# Patient Record
Sex: Female | Born: 2000 | Race: Black or African American | Hispanic: No | Marital: Single | Smoking: Never smoker
Health system: Southern US, Community
[De-identification: ages and names within clinical notes are randomized; demographics above are authoritative.]

## PROBLEM LIST (undated history)

## (undated) DIAGNOSIS — J302 Other seasonal allergic rhinitis: Secondary | ICD-10-CM

---
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
## 2020-08-14 ENCOUNTER — Ambulatory Visit: Payer: Self-pay | Admitting: Nurse Practitioner

## 2022-04-10 ENCOUNTER — Other Ambulatory Visit: Payer: Self-pay

## 2022-04-10 ENCOUNTER — Inpatient Hospital Stay (HOSPITAL_COMMUNITY)
Admission: EM | Admit: 2022-04-10 | Discharge: 2022-04-15 | DRG: 059 | Disposition: A | Discharge status: Home / Self Care | Payer: PRIVATE HEALTH INSURANCE | Attending: Family Medicine | Admitting: Family Medicine

## 2022-04-10 ENCOUNTER — Encounter (HOSPITAL_COMMUNITY): Payer: Self-pay | Admitting: Emergency Medicine

## 2022-04-10 DIAGNOSIS — R262 Difficulty in walking, not elsewhere classified: Secondary | ICD-10-CM | POA: Diagnosis present

## 2022-04-10 DIAGNOSIS — R8290 Unspecified abnormal findings in urine: Secondary | ICD-10-CM | POA: Diagnosis present

## 2022-04-10 DIAGNOSIS — M21371 Foot drop, right foot: Secondary | ICD-10-CM | POA: Diagnosis present

## 2022-04-10 DIAGNOSIS — F1290 Cannabis use, unspecified, uncomplicated: Secondary | ICD-10-CM | POA: Diagnosis present

## 2022-04-10 DIAGNOSIS — R3911 Hesitancy of micturition: Secondary | ICD-10-CM | POA: Diagnosis present

## 2022-04-10 DIAGNOSIS — N390 Urinary tract infection, site not specified: Secondary | ICD-10-CM | POA: Diagnosis present

## 2022-04-10 DIAGNOSIS — R200 Anesthesia of skin: Secondary | ICD-10-CM | POA: Diagnosis present

## 2022-04-10 DIAGNOSIS — Z820 Family history of epilepsy and other diseases of the nervous system: Secondary | ICD-10-CM

## 2022-04-10 DIAGNOSIS — F419 Anxiety disorder, unspecified: Secondary | ICD-10-CM | POA: Diagnosis present

## 2022-04-10 DIAGNOSIS — G35 Multiple sclerosis: Secondary | ICD-10-CM | POA: Diagnosis not present

## 2022-04-10 DIAGNOSIS — G379 Demyelinating disease of central nervous system, unspecified: Principal | ICD-10-CM | POA: Diagnosis present

## 2022-04-10 DIAGNOSIS — R8281 Pyuria: Secondary | ICD-10-CM | POA: Insufficient documentation

## 2022-04-10 HISTORY — DX: Other seasonal allergic rhinitis: J30.2

## 2022-04-10 LAB — CBC
HCT: 40.8 % (ref 36.0–46.0)
Hemoglobin: 13.6 g/dL (ref 12.0–15.0)
MCH: 28.9 pg (ref 26.0–34.0)
MCHC: 33.3 g/dL (ref 30.0–36.0)
MCV: 86.8 fL (ref 80.0–100.0)
Platelets: 313 10*3/uL (ref 150–400)
RBC: 4.7 MIL/uL (ref 3.87–5.11)
RDW: 13.7 % (ref 11.5–15.5)
WBC: 7.3 10*3/uL (ref 4.0–10.5)
nRBC: 0 % (ref 0.0–0.2)

## 2022-04-10 LAB — URINALYSIS, ROUTINE W REFLEX MICROSCOPIC
Bilirubin Urine: NEGATIVE
Glucose, UA: NEGATIVE mg/dL
Hgb urine dipstick: NEGATIVE
Ketones, ur: 80 mg/dL — AB
Nitrite: NEGATIVE
Protein, ur: 30 mg/dL — AB
Specific Gravity, Urine: 1.031 — ABNORMAL HIGH (ref 1.005–1.030)
pH: 5 (ref 5.0–8.0)

## 2022-04-10 LAB — COMPREHENSIVE METABOLIC PANEL
ALT: 15 U/L (ref 0–44)
AST: 14 U/L — ABNORMAL LOW (ref 15–41)
Albumin: 3.8 g/dL (ref 3.5–5.0)
Alkaline Phosphatase: 46 U/L (ref 38–126)
Anion gap: 7 (ref 5–15)
BUN: 9 mg/dL (ref 6–20)
CO2: 22 mmol/L (ref 22–32)
Calcium: 9.4 mg/dL (ref 8.9–10.3)
Chloride: 110 mmol/L (ref 98–111)
Creatinine, Ser: 0.84 mg/dL (ref 0.44–1.00)
GFR, Estimated: >60 mL/min (ref >60)
Glucose, Bld: 91 mg/dL (ref 70–99)
Potassium: 4 mmol/L (ref 3.5–5.1)
Sodium: 139 mmol/L (ref 135–145)
Total Bilirubin: 1 mg/dL (ref 0.3–1.2)
Total Protein: 7.3 g/dL (ref 6.5–8.1)

## 2022-04-10 LAB — I-STAT BETA HCG BLOOD, ED (MC, WL, AP ONLY): I-stat hCG, quantitative: <5 m[IU]/mL (ref <5)

## 2022-04-10 LAB — CK: Total CK: 71 U/L (ref 38–234)

## 2022-04-10 NOTE — ED Provider Triage Note (Signed)
Emergency Medicine Provider Triage Evaluation Note ? ?Robin Mercer , a 21 y.o. female  was evaluated in triage.  Pt complains of intermittent numbness since about 1 week ago. She states she has had some weakness for 1 month (and was referred to neurology then).  ? ?She states the weakness seems to follow her anxiety however now weakness is more often.  ? ?States currently no weakness.  ? ? ?Apt w neurology on the 30th. ? ?Review of Systems  ?Positive: Leg weakness and numbness ?Negative: Back pain, fever ? ?Physical Exam  ?BP 126/73 (BP Location: Right Arm)   Pulse 75   Temp 98.6 ?F (37 ?C) (Oral)   Resp 16   Ht 5\' 6"  (1.676 m)   Wt 76 kg   SpO2 100%   BMI 27.04 kg/m?  ?Gen:   Awake, tearful ?Resp:  Normal effort  ?MSK:   Moves extremities without difficulty  ?Other:  No midline C/T/L spine TTP  ?Strenght in LE symmetric ? ?Medical Decision Making  ?Medically screening exam initiated at 9:17 PM.  Appropriate orders placed.  Emmerie Battaglia was informed that the remainder of the evaluation will be completed by another provider, this initial triage assessment does not replace that evaluation, and the importance of remaining in the ED until their evaluation is complete. ? ?Labs.  ?  ?Robin Mercer, Gailen Shelter ?04/10/22 2121 ? ?

## 2022-04-10 NOTE — ED Triage Notes (Signed)
Patient reports intermittent right leg numbness onset last week worse when walking , denies injury , ambulatory .  ?

## 2022-04-11 ENCOUNTER — Encounter (HOSPITAL_COMMUNITY): Payer: Self-pay | Admitting: Internal Medicine

## 2022-04-11 ENCOUNTER — Emergency Department (HOSPITAL_COMMUNITY): Payer: PRIVATE HEALTH INSURANCE

## 2022-04-11 DIAGNOSIS — F1290 Cannabis use, unspecified, uncomplicated: Secondary | ICD-10-CM | POA: Diagnosis present

## 2022-04-11 DIAGNOSIS — F419 Anxiety disorder, unspecified: Secondary | ICD-10-CM | POA: Diagnosis present

## 2022-04-11 DIAGNOSIS — R262 Difficulty in walking, not elsewhere classified: Secondary | ICD-10-CM | POA: Diagnosis present

## 2022-04-11 DIAGNOSIS — M21371 Foot drop, right foot: Secondary | ICD-10-CM | POA: Diagnosis present

## 2022-04-11 DIAGNOSIS — R8290 Unspecified abnormal findings in urine: Secondary | ICD-10-CM | POA: Diagnosis not present

## 2022-04-11 DIAGNOSIS — G35 Multiple sclerosis: Secondary | ICD-10-CM | POA: Diagnosis present

## 2022-04-11 DIAGNOSIS — R3911 Hesitancy of micturition: Secondary | ICD-10-CM | POA: Insufficient documentation

## 2022-04-11 DIAGNOSIS — N390 Urinary tract infection, site not specified: Secondary | ICD-10-CM | POA: Diagnosis present

## 2022-04-11 DIAGNOSIS — G379 Demyelinating disease of central nervous system, unspecified: Secondary | ICD-10-CM | POA: Insufficient documentation

## 2022-04-11 DIAGNOSIS — Z820 Family history of epilepsy and other diseases of the nervous system: Secondary | ICD-10-CM | POA: Diagnosis not present

## 2022-04-11 DIAGNOSIS — R200 Anesthesia of skin: Secondary | ICD-10-CM | POA: Diagnosis present

## 2022-04-11 DIAGNOSIS — R8281 Pyuria: Secondary | ICD-10-CM | POA: Diagnosis not present

## 2022-04-11 LAB — HIV ANTIBODY (ROUTINE TESTING W REFLEX): HIV Screen 4th Generation wRfx: NONREACTIVE

## 2022-04-11 LAB — TSH: TSH: 0.937 u[IU]/mL (ref 0.350–4.500)

## 2022-04-11 IMAGING — MR MR LUMBAR SPINE WO/W CM
4 of 7 series · 25 of 48 positions shown · IV contrast (gadavist)
Comparison: Brain MRI today reported separately.

CLINICAL DATA: 21-year-old female with intermittent numbness and
weakness. Impaired coordination of the right leg.

EXAM:
MRI LUMBAR SPINE WITHOUT AND WITH CONTRAST
TECHNIQUE: Multiplanar and multiecho pulse sequences of the lumbar spine were
obtained without and with intravenous contrast.
CONTRAST:  7mL GADAVIST GADOBUTROL 1 MMOL/ML IV SOLN

[Series 13: T2 · sagittal · 4.0mm · 0.68mm/px · 3 of 16 slices shown (1 of 2)]
[im 1/16]
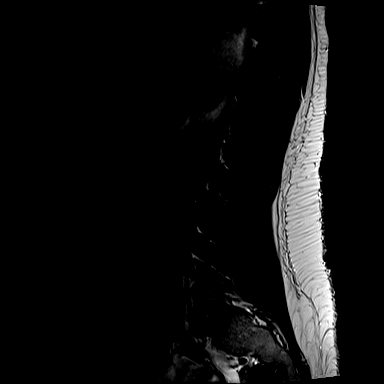
[im 8/16]
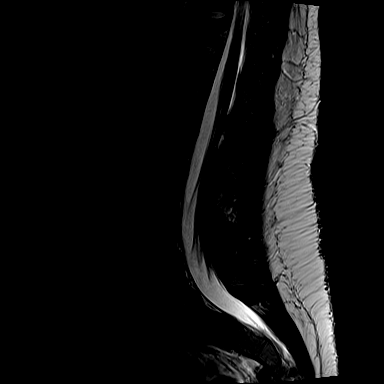
[im 16/16]
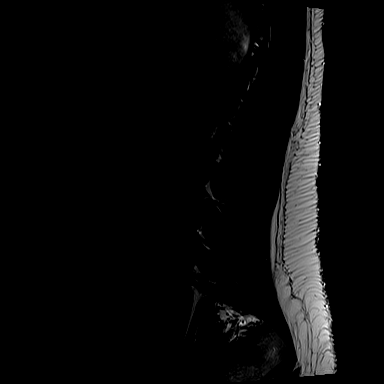

[Series 15: T1 · sagittal · 4.0mm · 0.88mm/px · 4 of 16 slices shown (1 of 2)]
[im 1/16]
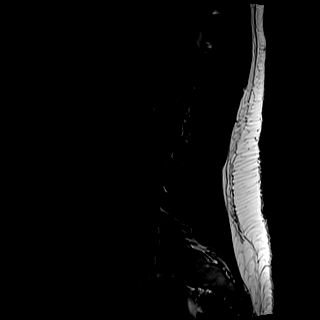
[im 6/16]
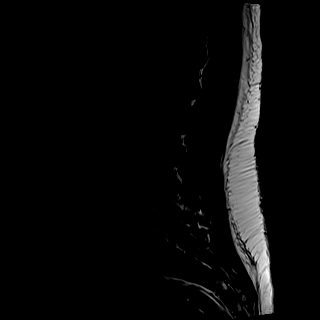
[im 11/16]
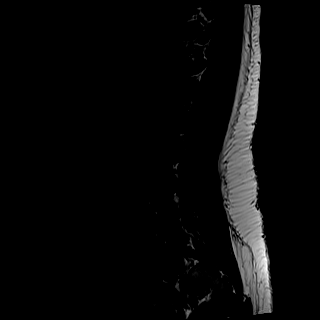
[im 16/16]
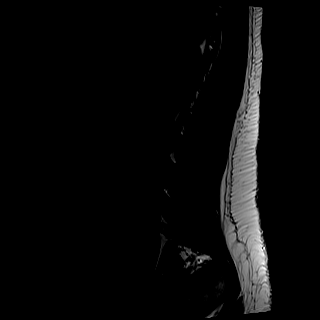

[Series 16: T2 · axial · 4.0mm · 0.62mm/px · z∈[-564,-319]mm · 11 of 46 slices shown (2 of 2)]
[im 1/46]
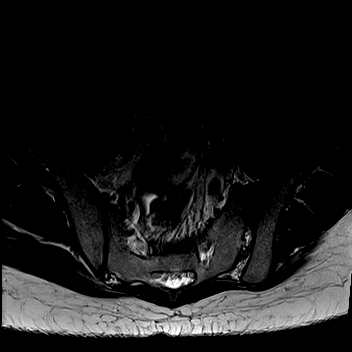
[im 5/46]
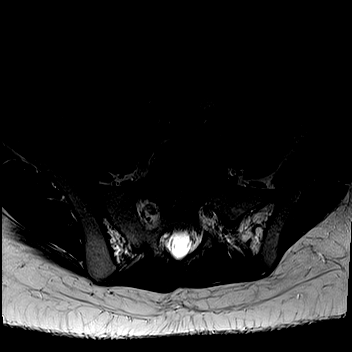
[im 10/46]
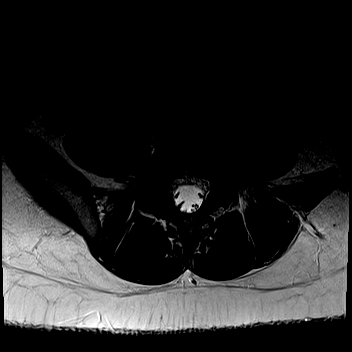
[im 14/46]
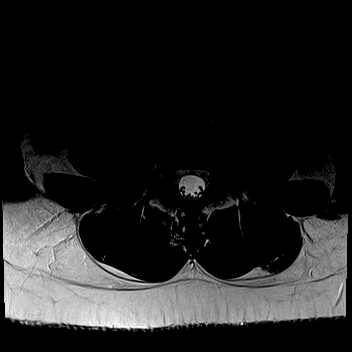
[im 19/46]
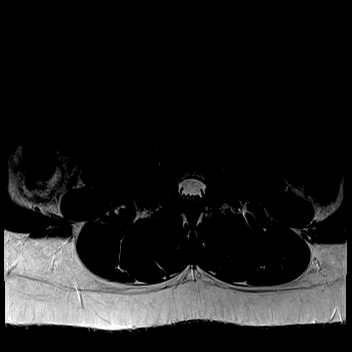
[im 23/46]
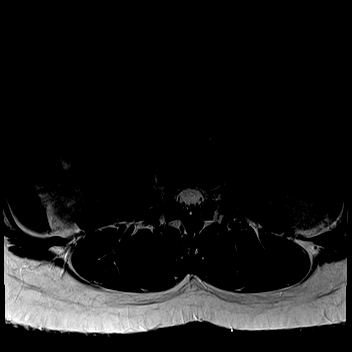
[im 28/46]
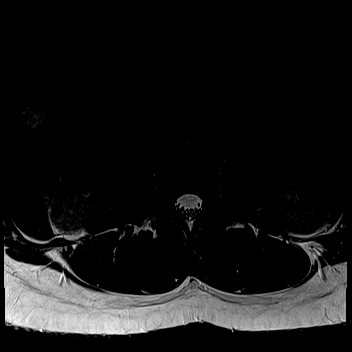
[im 32/46]
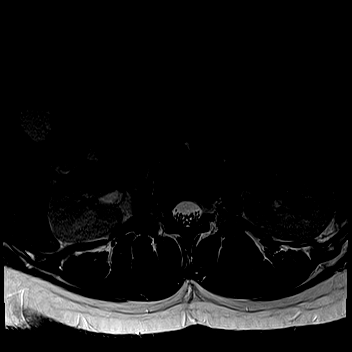
[im 37/46]
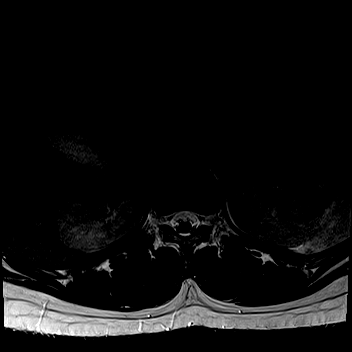
[im 41/46]
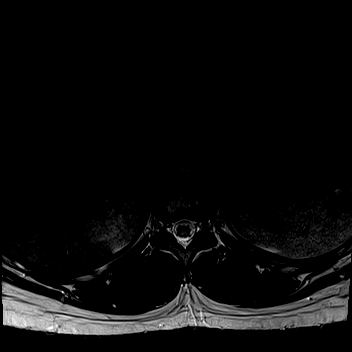
[im 46/46]
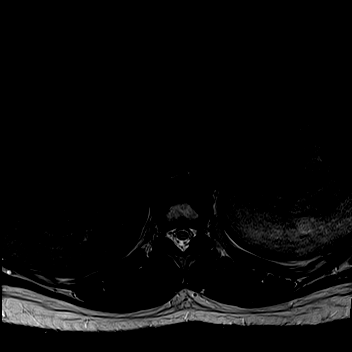

[Series 17: T1 · axial · 4.0mm · 0.34mm/px · z∈[-564,-344]mm · 7 of 46 slices shown (2 of 2)]
[im 1/46]
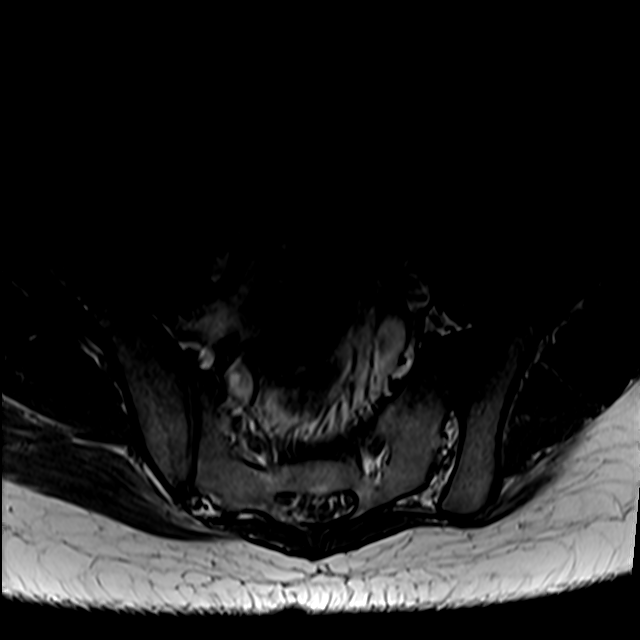
[im 5/46]
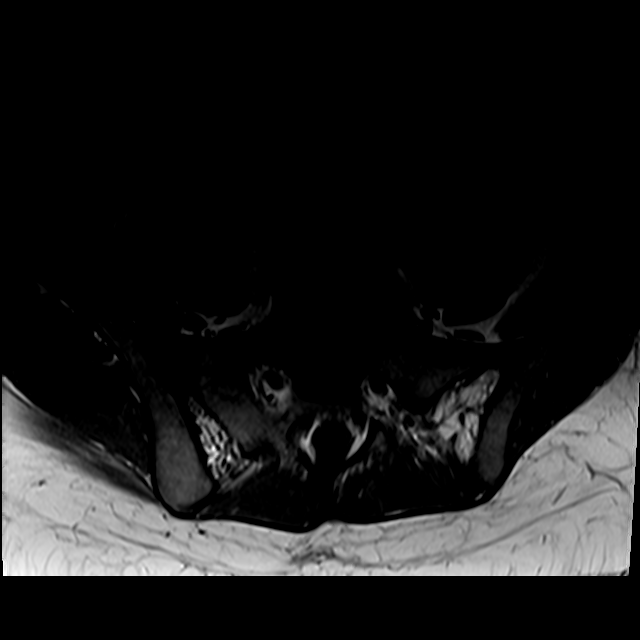
[im 10/46]
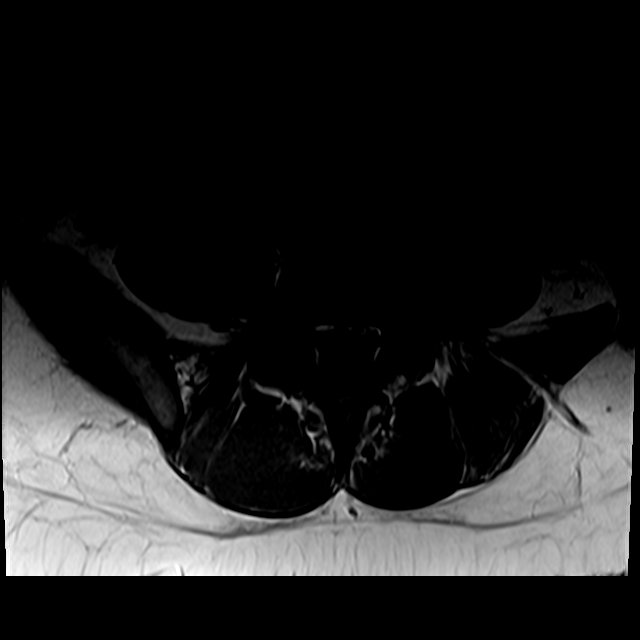
[im 14/46]
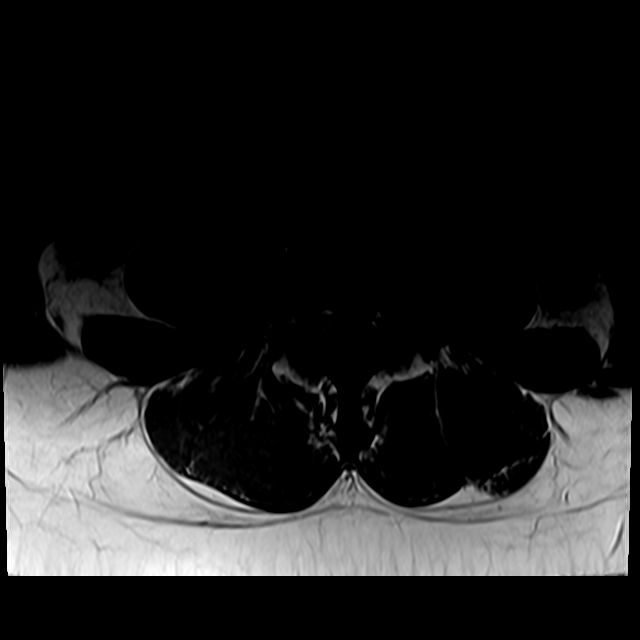
[im 19/46]
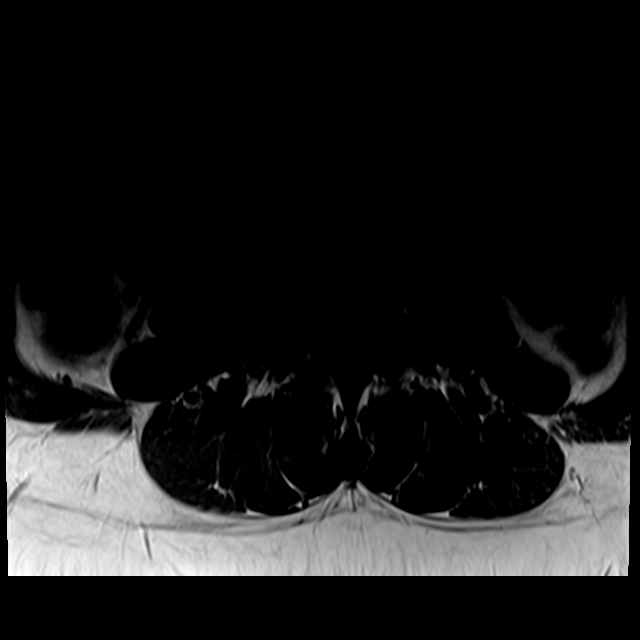
[im 23/46]
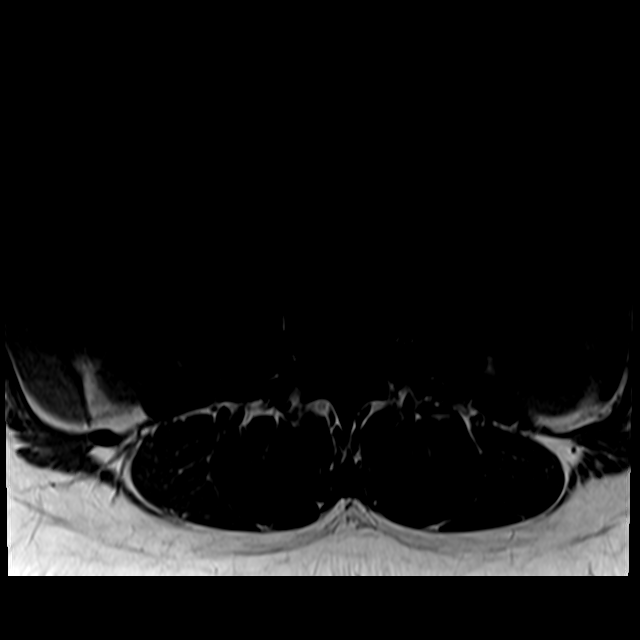
[im 41/46]
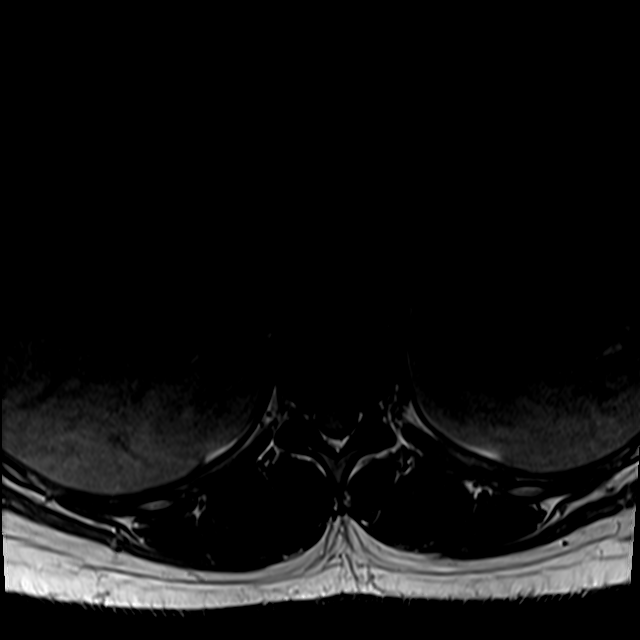

[25 of 48 positions shown; findings below may reference images not displayed]

FINDINGS: Segmentation: Lumbar segmentation appears to be normal and will be
designated as such for this report.

Alignment: Normal lumbar lordosis. There is subtle levoconvex lumbar
scoliosis. No spondylolisthesis.

Vertebrae: No marrow edema or evidence of acute osseous abnormality.
Visualized bone marrow signal is within normal limits. Intact
visible sacrum.

Conus medullaris and cauda equina: Conus extends to the T12-L1

Signal in the conus appears normal, but there is subtle STIR and
axial T2 imaging evidence of abnormal lower thoracic spinal cord
lesions at T10 and T11/T12 (series 14, image 8 and series 16, image
7). No evidence of abnormal lower spinal cord or conus enhancement.

Cauda equina nerve roots appear normal. No abnormal lumbar
intradural enhancement. No dural thickening. Level. Conus and cauda
equina appear normal.

Paraspinal and other soft tissues: Negative.

Disc levels:

Essentially negative. Capacious spinal canal. Normal intervertebral
disc signal and morphology.

There is mild right side lower lumbar posterior element hypertrophy.
IMPRESSION: Negative MRI appearance of the Lumbar Spine, but evidence of
multifocal lower thoracic Spinal Cord lesions suspicious for chronic
demyelinating in light of Brain MRI findings today.

## 2022-04-11 IMAGING — MR MR HEAD WO/W CM
14 of 16 series · 42 of 48 positions shown · IV contrast (gadavist)
Comparison: None Available.

CLINICAL DATA: 21-year-old female with intermittent numbness and
weakness. Impaired coordination of the right leg.

EXAM:
MRI HEAD WITHOUT AND WITH CONTRAST
TECHNIQUE: Multiplanar, multiecho pulse sequences of the brain and surrounding
structures were obtained without and with intravenous contrast.
CONTRAST:  7mL GADAVIST GADOBUTROL 1 MMOL/ML IV SOLN

[Series 5: DWI · axial · 3.0mm · 0.96mm/px · z∈[-130,+13]mm · 8 of 114 slices shown (1 of 4)]
[im 1/114]
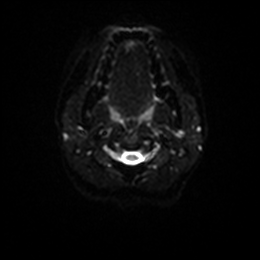
[im 17/114]
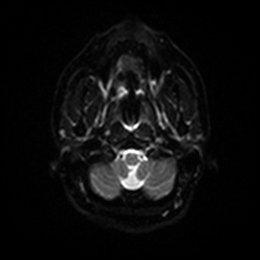
[im 33/114]
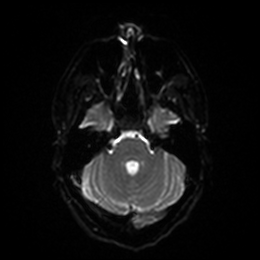
[im 49/114]
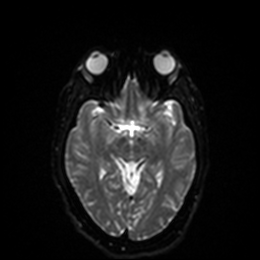
[im 65/114]
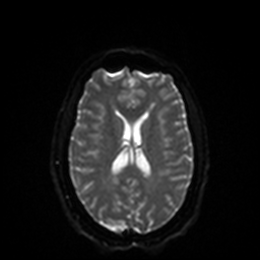
[im 81/114]
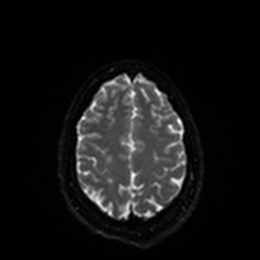
[im 97/114]
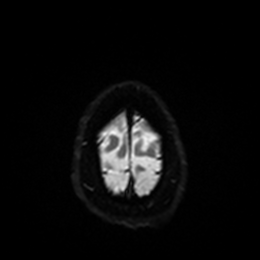
[im 114/114]
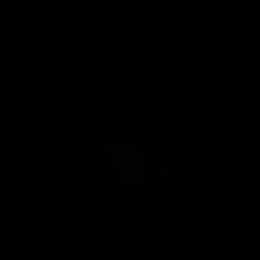

[Series 6: DWI · axial · 3.0mm · 0.96mm/px · z∈[-130,+13]mm · 3 of 57 slices shown (2 of 4)]
[im 1/57]
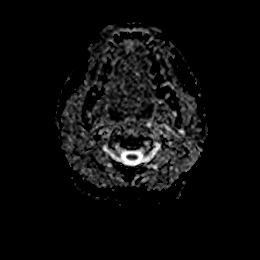
[im 29/57]
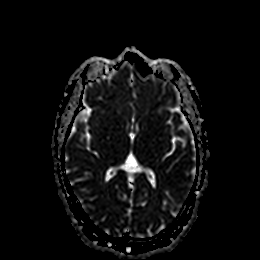
[im 57/57]
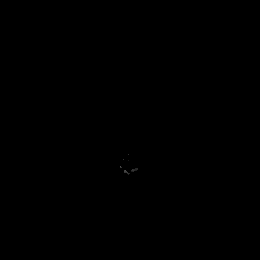

[Series 7: DWI · coronal · 4.0mm · 0.88mm/px · 5 of 84 slices shown (3 of 4)]
[im 1/84]
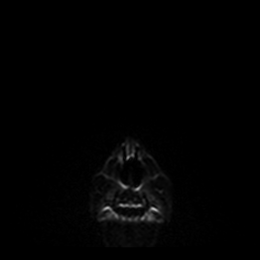
[im 21/84]
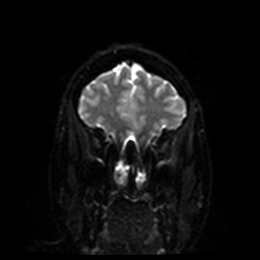
[im 42/84]
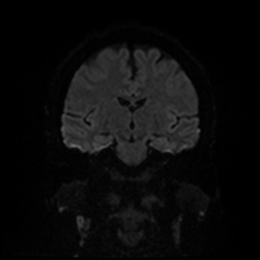
[im 63/84]
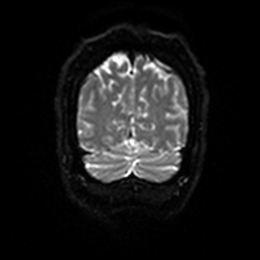
[im 84/84]
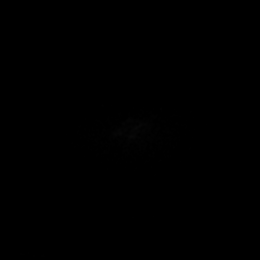

[Series 8: DWI · coronal · 4.0mm · 0.88mm/px · 2 of 42 slices shown (4 of 4)]
[im 1/42]
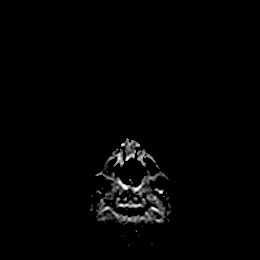
[im 42/42]
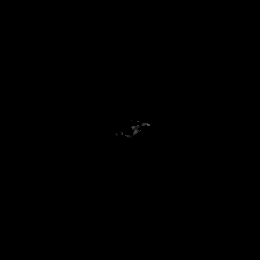

[Series 9: T1 · sagittal · 5.0mm · 0.78mm/px · 2 of 27 slices shown]
[im 1/27]
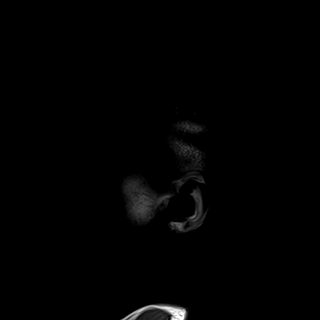
[im 27/27]
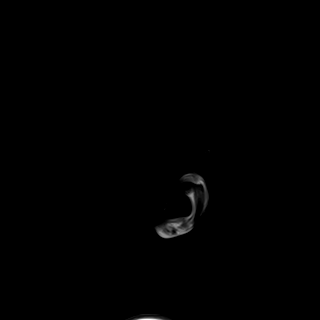

[Series 10: T2 · axial · 5.0mm · 0.78mm/px · z∈[-133,+16]mm · 2 of 30 slices shown]
[im 1/30]
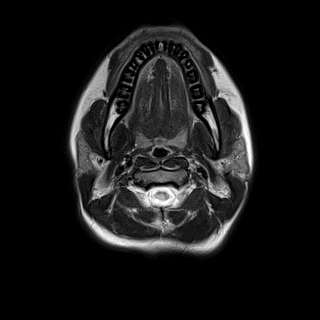
[im 30/30]
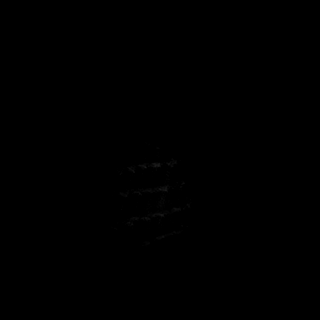

[Series 11: FLAIR · axial · 5.0mm · 0.49mm/px · z∈[-131,+17]mm · 2 of 30 slices shown]
[im 1/30]
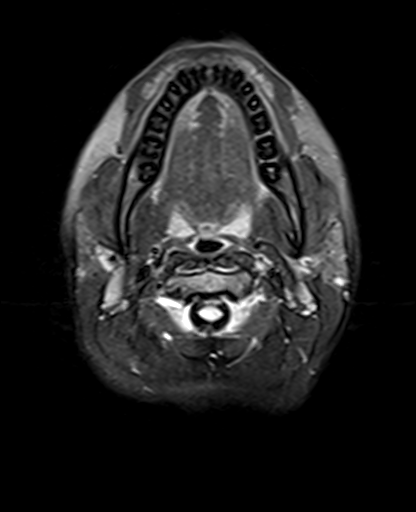
[im 30/30]
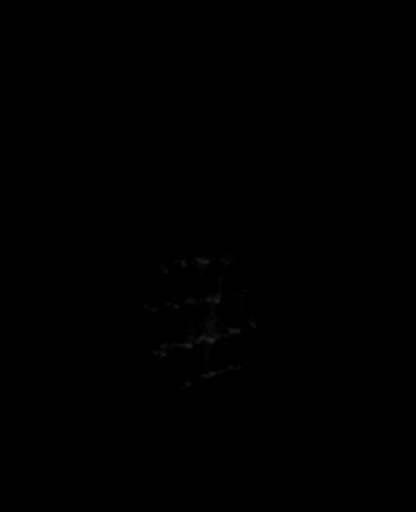

[Series 12: mag_images · axial · 3.0mm · 0.98mm/px · z∈[-132,+19]mm · 3 of 60 slices shown]
[im 1/60]
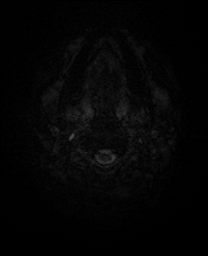
[im 30/60]
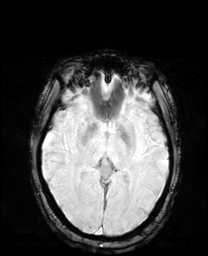
[im 60/60]
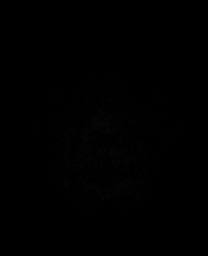

[Series 13: pha_images · axial · 3.0mm · 0.98mm/px · z∈[-132,+14]mm · 3 of 58 slices shown]
[im 1/58]
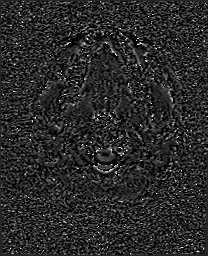
[im 29/58]
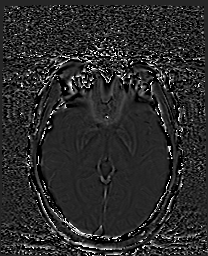
[im 58/58]
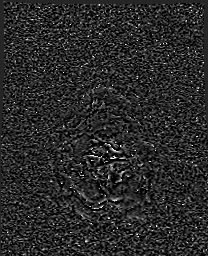

[Series 14: swi_images · axial · 3.0mm · 0.98mm/px · z∈[-132,+19]mm · 3 of 60 slices shown]
[im 1/60]
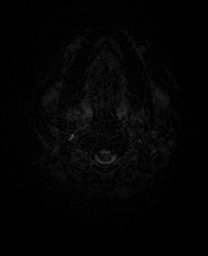
[im 30/60]
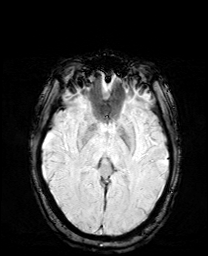
[im 60/60]
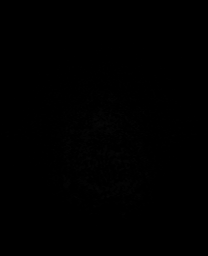

[Series 15: mip_images(sw) · axial · 24.0mm · 0.98mm/px · z∈[-123,+10]mm · 3 of 53 slices shown]
[im 1/53]
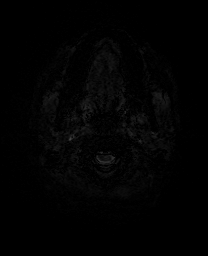
[im 27/53]
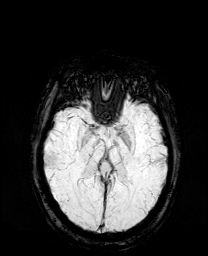
[im 53/53]
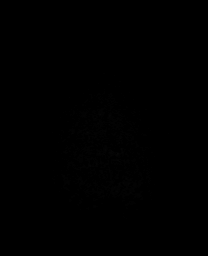

[Series 17: T2 post-contrast · coronal · 5.0mm · 0.72mm/px · 2 of 35 slices shown]
[im 1/35]
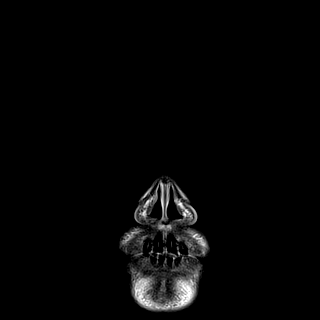
[im 35/35]
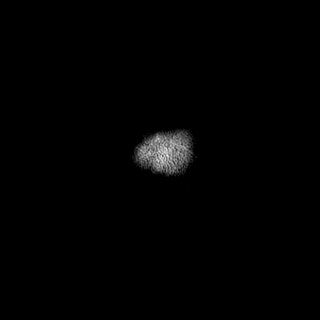

[Series 19: T1 post-contrast · coronal · 5.0mm · 0.34mm/px · 2 of 35 slices shown (1 of 2)]
[im 1/35]
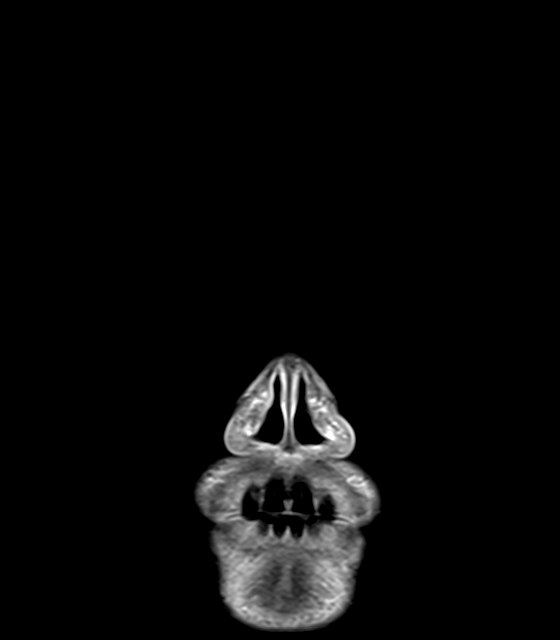
[im 35/35]
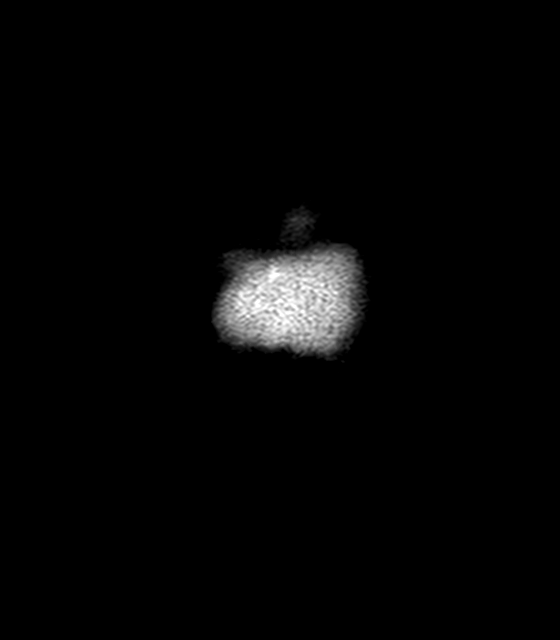

[Series 20: T1 post-contrast · sagittal · 5.0mm · 0.78mm/px · 2 of 27 slices shown (2 of 2)]
[im 1/27]
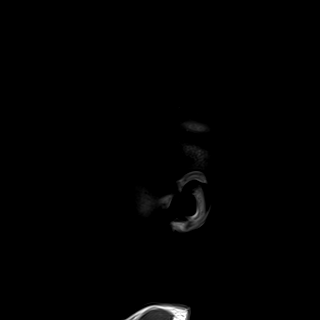
[im 27/27]
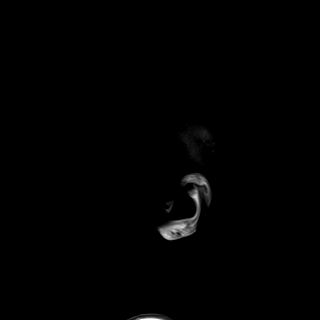

[42 of 48 positions shown; findings below may reference images not displayed]

FINDINGS: Brain: There is abnormal T2 hyperintensity and volume loss in the
C1-C2 spinal cord visible on the 1st axial T2 image series 10, image
1. No evidence of associated enhancement there.

Round and nodular abnormal bilateral cerebral white matter T2 and
FLAIR hyperintensity, most pronounced in the right centrum semi of
bowel (series 11, image 21) with abnormal diffusion although mostly
T2 shine through (series 5, image 96). No associated enhancement. A
right periatrial white matter lesion also has mildly abnormal
diffusion but no enhancement. Temporal lobes appear spared. Deep
gray nuclei, brainstem and cerebellum appear spared. Subcortical
white matter involvement but no cortical involvement or
encephalomalacia. No chronic cerebral blood products.

No abnormal enhancement identified. No restricted diffusion
suggestive of acute infarction. No midline shift, mass effect,
evidence of mass lesion, ventriculomegaly, extra-axial collection or
acute intracranial hemorrhage. Cervicomedullary junction and
pituitary are within normal limits. No dural thickening.

Cystic change to the pineal gland (series 11, image 14 and series 9,
image 14) with no suspicious enhancement or regional mass effect.

Vascular: Major intracranial vascular flow voids are preserved. The
major dural venous sinuses are enhancing and appear to be patent.

Skull and upper cervical spine: Visualized bone marrow signal is
within normal limits. Abnormal C2 cervical spinal cord T2
hyperintensity as above, but grossly normal spinal cord appearance
on sagittal T1 imaging.

Sinuses/Orbits: Grossly symmetric and normal orbits. Paranasal
sinuses and mastoids are stable and well aerated.

Other: Visible internal auditory structures appear normal. Negative
visible scalp and face.
IMPRESSION: 1. Abnormal cerebral white matter signal, and evidence of abnormal
Cervical Spinal Cord signal at the C2 level, has a configuration
most suggestive of demyelinating disease.
Possible subacute demyelination in the right hemisphere, but no
enhancing lesions.

2. No other acute intracranial abnormality.

## 2022-04-11 MED ORDER — SODIUM CHLORIDE 0.9 % IV SOLN
1000.0000 mg | Freq: Every day | INTRAVENOUS | Status: AC
  Administered 2022-04-11 – 2022-04-15 (×5): 1000 mg via INTRAVENOUS
  Filled 2022-04-11: qty 8
  Filled 2022-04-11 (×3): qty 16
  Filled 2022-04-11: qty 8

## 2022-04-11 MED ORDER — ACETAMINOPHEN 650 MG RE SUPP: 650.0000 mg | Freq: Four times a day (QID) | RECTAL | Status: DC | PRN

## 2022-04-11 MED ORDER — ENOXAPARIN SODIUM 40 MG/0.4ML IJ SOSY
40.0000 mg | PREFILLED_SYRINGE | Freq: Every day | INTRAMUSCULAR | Status: DC
  Administered 2022-04-11 – 2022-04-15 (×5): 40 mg via SUBCUTANEOUS
  Filled 2022-04-11 (×5): qty 0.4

## 2022-04-11 MED ORDER — ALBUTEROL SULFATE (2.5 MG/3ML) 0.083% IN NEBU: 2.5000 mg | INHALATION_SOLUTION | Freq: Four times a day (QID) | RESPIRATORY_TRACT | Status: DC | PRN

## 2022-04-11 MED ORDER — SODIUM CHLORIDE 0.9 % IV SOLN
1.0000 g | INTRAVENOUS | Status: DC
  Administered 2022-04-11 – 2022-04-12 (×2): 1 g via INTRAVENOUS
  Filled 2022-04-11 (×2): qty 10

## 2022-04-11 MED ORDER — HYDRALAZINE HCL 20 MG/ML IJ SOLN
10.0000 mg | INTRAMUSCULAR | Status: DC | PRN
Start: 2022-04-11 — End: 2022-04-15

## 2022-04-11 MED ORDER — SODIUM CHLORIDE 0.9 % IV SOLN: Freq: Once | INTRAVENOUS | Status: AC

## 2022-04-11 MED ORDER — GADOBUTROL 1 MMOL/ML IV SOLN
7.0000 mL | Freq: Once | INTRAVENOUS | Status: AC | PRN
  Administered 2022-04-11: 7 mL via INTRAVENOUS

## 2022-04-11 MED ORDER — HYDROXYZINE HCL 10 MG PO TABS: 10.0000 mg | ORAL_TABLET | Freq: Three times a day (TID) | ORAL | Status: DC | PRN

## 2022-04-11 MED ORDER — ACETAMINOPHEN 325 MG PO TABS
650.0000 mg | ORAL_TABLET | Freq: Four times a day (QID) | ORAL | Status: DC | PRN
Start: 2022-04-11 — End: 2022-04-15

## 2022-04-11 NOTE — ED Notes (Signed)
Patient to ER room 35 via ambulation. Patient reports intermittent right leg numbness for over one week. Patient reports no known injury to leg. Patient denies numbness or pain at this time. Patient is alert and oriented and calm, call bell in reach.

## 2022-04-11 NOTE — ED Notes (Signed)
Breakfast tray ordered 

## 2022-04-11 NOTE — ED Provider Notes (Signed)
[facility] EMERGENCY DEPARTMENT Provider Note   CSN: [id number] Arrival date & time: 04/10/22  2035     History  Chief Complaint  Patient presents with   Numbness    Robin Mercer is a 21 y.o. female who presents with concern for impaired coordination and difficulty walking secondary to weakness in the right leg intermittently for the last 2 years, significantly worse in the last few months now with new numbness and pins and needle sensation in the right leg intermittently this week.  No history of back injury.  Was seen once in urgent care and referred outpatient to neurology and has upcoming appointment.  Recently relocated from [place].  Maternal grandmother with MS.  I personally reviewed this patient's medical records.  She otherwise does not carry medical diagnoses and is not any medications daily.  HPI     Home Medications Prior to Admission medications   Not on File      Allergies    Patient has no known allergies.    Review of Systems   Review of Systems  Genitourinary:  Positive for difficulty urinating.  Neurological:  Positive for weakness and numbness.   Physical Exam Updated Vital Signs BP 126/73 (BP Location: Right Arm)   Pulse 75   Temp 98.6 F (37 C) (Oral)   Resp 16   Ht 5\' 6"  (1.676 m)   Wt 76 kg   SpO2 100%   BMI 27.04 kg/m  Physical Exam Vitals and nursing note reviewed.  Constitutional:      Appearance: She is not ill-appearing or toxic-appearing.  HENT:     Head: Normocephalic and atraumatic.     Nose: Nose normal.     Mouth/Throat:     Mouth: Mucous membranes are moist.     Pharynx: No oropharyngeal exudate or posterior oropharyngeal erythema.  Eyes:     General: Lids are normal. Vision grossly intact.        Right eye: No discharge.        Left eye: No discharge.     Extraocular Movements: Extraocular movements intact.     Conjunctiva/sclera: Conjunctivae normal.     Pupils: Pupils are equal, round, and  reactive to light.     Visual Fields: Right eye visual fields normal and left eye visual fields normal.  Neck:     Trachea: Trachea and phonation normal.  Cardiovascular:     Rate and Rhythm: Normal rate and regular rhythm.     Pulses: Normal pulses.     Heart sounds: Normal heart sounds. No murmur heard. Pulmonary:     Effort: Pulmonary effort is normal. No respiratory distress.     Breath sounds: Normal breath sounds. No wheezing or rales.  Abdominal:     General: Bowel sounds are normal. There is no distension.     Palpations: Abdomen is soft.     Tenderness: There is no abdominal tenderness. There is no right CVA tenderness, left CVA tenderness, guarding or rebound.  Musculoskeletal:        General: No deformity.     Cervical back: Normal range of motion and neck supple. No bony tenderness.     Thoracic back: No bony tenderness.     Lumbar back: No bony tenderness.     Right lower leg: No edema.     Left lower leg: No edema.  Lymphadenopathy:     Cervical: No cervical adenopathy.  Skin:    General: Skin is warm and dry.  Capillary Refill: Capillary refill takes less than 2 seconds.  Neurological:     Mental Status: She is alert and oriented to person, place, and time. Mental status is at baseline.     GCS: GCS eye subscore is 4. GCS verbal subscore is 5. GCS motor subscore is 6.     Cranial Nerves: Cranial nerves 2-12 are intact.     Sensory: Sensation is intact.     Motor: Motor function is intact. No pronator drift.     Coordination: Romberg sign negative. Heel to [facility] Test abnormal. Finger-Nose-Finger Test normal.     Gait: Gait abnormal.     Comments: Subtle questionable deficit in strength in the right knee flexion and extension relative to the left.  Symmetric plantar and dorsiflexion.  Gait is abnormal, patient's coordination with the right leg is impaired, no dropfoot but patient seems to have a difficult time lifting the foot to step appropriately.  Unable to stand  on the right leg alone where she bounces on the left leg without difficulty.  Psychiatric:        Mood and Affect: Mood normal.    ED Results / Procedures / Treatments   Labs (all labs ordered are listed, but only abnormal results are displayed) Labs Reviewed  URINALYSIS, ROUTINE W REFLEX MICROSCOPIC - Abnormal; Notable for the following components:      Result Value   APPearance HAZY (*)    Specific Gravity, Urine 1.031 (*)    Ketones, ur 80 (*)    Protein, ur 30 (*)    Leukocytes,Ua MODERATE (*)    Bacteria, UA RARE (*)    All other components within normal limits  COMPREHENSIVE METABOLIC PANEL - Abnormal; Notable for the following components:   AST 14 (*)    All other components within normal limits  CK  CBC  I-STAT BETA HCG BLOOD, ED ([facility], [facility], [facility] ONLY)    EKG None  Radiology No results found.  Procedures Procedures    Medications Ordered in ED Medications - No data to display  ED Course/ Medical Decision Making/ A&P Clinical Course as of 04/11/22 0521  Thu Apr 11, 2022  1610 Consult to Dr. Wilford Corner, neurologist, who recommends MR brain w/wo contrast. I appreciate his collaboration in the care of this patient.  [RS]  [phone] Patient in MRI.  [RS]    Clinical Course User Index [RS] Jahne Krukowski, Eugene Gavia, PA-C                           Medical Decision Making 21 year old female who presents with concern for abnormal gait and intermittent urinary retention.   The differential diagnosis of weakness includes but is not limited to: Neurologic causes: GBS, myasthenia gravis, CVA, MS, ALS, transverse myelitis, spinal cord injury, CVA, botulism Other causes: ACS, Arrhythmia, syncope, orthostatic hypotension, sepsis, hypoglycemia, electrolyte disturbance, hypothyroidism, respiratory failure, symptomatic anemia, dehydration, heat injury, polypharmacy, malignancy.  Vital signs are normal and intake.  Cardiopulmonary and abdominal exams are benign.  Neurologic exam as above  with concerning findings during gait evaluation. Will obtain MRI Brain and lumbar spine given reported urinary retention for evaluation of cauda equina syndrome.    Amount and/or Complexity of Data Reviewed Labs:     Details: CBC, CMP , CK unremarkalble. UA with proteinuria and ketonuria. Radiology: ordered.    Details: MR brain, MR lumbar pending at time of shift change.   Care of this patient signed out to  oncoming ED provider Mertha Baars, PA-C at time of shift change.  All pertinent HPI, physical exam, laboratory findings were discussed with father prior to my departure.  Patient awaiting results of MR imaging at time of shift change.  Disposition pending MRI results.  Maizy voiced understanding of her medical evaluation and treatment plan thus far.  She is amenable to await MR results.   This chart was dictated using voice recognition software, Dragon. Despite the best efforts of this provider to proofread and correct errors, errors may still occur which can change documentation meaning. Final Clinical Impression(s) / ED Diagnoses Final diagnoses:  None    Rx / DC Orders ED Discharge Orders     None         Sherrilee Gilles 04/11/22 8675    Sabas Sous, MD 04/11/22 [phone]

## 2022-04-11 NOTE — H&P (Signed)
History and Physical    Patient: Robin Mercer [id number] DOB: Aug 28, 2001 DOA: 04/10/2022 DOS: the patient was seen and examined on 04/11/2022 PCP: [facility]  Patient coming from: [place] walk-in clinic  Chief Complaint:  Chief Complaint  Patient presents with   Numbness   HPI: Robin Mercer is a 21 y.o. female with past medical history marijuana use presents with complaints of what she reports as numbness of her right leg.  History is obtained from the patient with the assistance of her parents present at bedside.  She is originally from [place], but family moved to [place] in August 2020.  Over the last 3 years she had been intermittently having issues with tingling and stiffness mostly in her right leg.  Symptoms seem to improve with rest.  However, over the last 4-5 months episodes seem to be occurring more frequently.  She had been seen at [facility] walk-in clinic for the symptoms on 4/14 and was referred to [facility] neurolog[facility] associates for which she has an appointment on 5/30.  Yesterday, while the patient was at work she developed what she states as "numbness" of the right leg which was reported to be new.  She describes the numbness as pins and needle sensation for which she was having difficulty walking and balancing on the right leg.  She had a cough recent that lasted 2-3 weeks, but symptoms have since resolved. The only other associated symptoms was reports of intermittent urinary hesitancy when going to use the restroom and some mild symptoms of feeling anxious from time to time.  She is not on any medications at baseline.  She does report daily use of marijuana, but denies any other drug use.    Patient denies any falls, changes in vision, fever, shortness of breath, nausea, vomiting, abdominal pain, saddle anesthesia, diarrhea, or dysuria symptoms.  Family history significant for maternal great grandmother with multiple sclerosis.  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  Upon admission into the emergency department patient was noted to have stable vital signs.  Lab work was unremarkable including negative pregnancy screen.  Urinalysis noted presence of ketones, moderate leukocytes, elevated specific gravity 1.031, rare bacteria, 0-5 squamous epithelial cells/lpf , and 0-5 WBCs.  MRI of the brain and lumbar spine with and without contrast noted abnormal cerebral white matter signal, evidence of abnormal cervical cord signal at C2, and multifocal lower thoracic spinal cord lesion suspicious for chronic demyelinating disease.  Review of Systems: As mentioned in the history of present illness. All other systems reviewed and are negative. Past Medical History:  Diagnosis Date   Seasonal allergies    History reviewed. No pertinent surgical history. Social History:  reports that she has never smoked. She has never used smokeless tobacco. She reports that she does not currently use alcohol. She reports current drug use. Drug: Marijuana.  No Known Allergies  Family History  Problem Relation Age of Onset   Multiple sclerosis Maternal Great-grandmother     Prior to Admission medications   Not on File    Physical Exam: Vitals:   04/11/22 0330 04/11/22 0400 04/11/22 0430 04/11/22 0730  BP: 101/77 107/77 102/75 96/62  Pulse: 73 68 71 60  Resp:   15 15  Temp:      TempSrc:      SpO2: 100% 99% 99% 99%  Weight:      Height:  Constitutional: Young female currently in NAD, calm, comfortable Eyes: PERRL, lids and conjunctivae normal ENMT: Mucous membranes are moist. Posterior pharynx clear of any exudate or lesions.Normal dentition.  Neck: normal, supple, no masses, no thyromegaly Respiratory: clear to auscultation bilaterally, no wheezing, no crackles. Normal respiratory effort. No accessory muscle use.  Cardiovascular: Regular rate and rhythm, no murmurs / rubs / gallops. No extremity  edema. 2+ pedal pulses. No carotid bruits.  Abdomen: no tenderness, no masses palpated. No hepatosplenomegaly. Bowel sounds positive.  Musculoskeletal: no clubbing / cyanosis. No joint deformity upper and lower extremities. Good ROM, no contractures. Normal muscle tone.  Skin: no rashes, lesions, ulcers. No induration Neurologic: CN 2-12 grossly intact. Sensation intact.  Strength 4/5 on the right lower extremity and 5/5 in all other extremities.  Gait was not tested for at this time. Psychiatric: Normal judgment and insight. Alert and oriented x 3. Normal mood.   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  She had been seen at our walk-in clinic 4/14 and referred to [facility] and has an appointment set up for 5/30.  MRI of the head and lumbar spine significant for multiple areas of demyelination.  Neurology have been formally consulted. -Admit to a MedSurg bed -Neurochecks -Check HIV -PT to evaluate and treat -Solu-Medrol 1 g IV daily per neurology recommendations -Normal saline IV fluids at 75 mL/h for 1 L -Appreciate neurology consultative services, will follow-up for further recommendations  Urinary hesitancy abnormal urinalysis Acute.  Patient has reported intermittent episodes of urinary hesitancy.  Urinalysis noted presence of ketones, moderate leukocytes, elevated specific gravity 1.031, rare bacteria, 0-5 squamous epithelial cells/lpf , and 0-5 WBCs. -Check bladder scans every shift with orders to notify MD if greater than 350 mL of urine present postvoid -Check GC chlamydia and urine culture -Rocephin IV  Marijuana use Patient reports daily use of marijuana. -Continue to counsel need of cessation of marijuana  use  Anxiety Patient reports that she has been feeling more anxious lately.  She does not have a formal diagnosis of anxiety. -Hydroxyzine p.o. if needed for anxiety Advance Care Planning:   Code Status: Full Code    Consults: Neurology  Family Communication: Patient's mother and father updated at bedside  Severity of Illness: The appropriate patient status for this patient is INPATIENT. Inpatient status is judged to be reasonable and necessary in order to provide the required intensity of service to ensure the patient's safety. The patient's presenting symptoms, physical exam findings, and initial radiographic and laboratory data in the context of their chronic comorbidities is felt to place them at high risk for further clinical deterioration. Furthermore, it is not anticipated that the patient will be medically stable for discharge from the hospital within 2 midnights of admission.   * I certify that at the point of admission it is my clinical judgment that the patient will require inpatient hospital care spanning beyond 2 midnights from the point of admission due to high intensity of service, high risk for further deterioration and high frequency of surveillance required.*  Author: Norval Morton, MD 04/11/2022 7:59 AM  For on call review [email or web address].

## 2022-04-11 NOTE — Progress Notes (Signed)
PT Cancellation Note  Patient Details Name: Robin Mercer MRN: [id number] DOB: 2001/09/03   Cancelled Treatment:    Reason Eval/Treat Not Completed: Other (comment) MD requesting to hold until tomorrow. Will follow up as schedule allows.   Cindee Salt, DPT  Acute Rehabilitation Services  Pager: [phone] Office: [phone]    Lehman Prom 04/11/2022, 12:34 PM

## 2022-04-11 NOTE — Progress Notes (Signed)
[facility] Needs  Code Status: FULL Umaiza Matusik is a 21 y.o. female patient admitted from ED awake, alert - oriented X4 - no acute distress noted. VSS -  no c/o shortness of breath, no c/o chest pain. Cardiac tele # in place. Orientation to room and floor completed with information packet given to patient. Call light within reach, patient able to voice, and demonstrate understanding. Skin, clean-dry- intact without evidence of bruising, or skin tears.  No evidence of skin break down noted on exam.  ?  Will cont to eval and treat per MD orders.  Jon Gills, RN  04/11/2022

## 2022-04-11 NOTE — ED Notes (Signed)
Patient to MRI.

## 2022-04-11 NOTE — ED Provider Notes (Signed)
Care of patient handed off to me at this time from [place], [place]. Please see her note for full workup up until handoff. Briefly, this is a 21 year old female who presents to the emergency department with difficulty walking due to weakness in the right leg. Weakness for 2 years, however, significantly worsening in the past month. No trauma or falls. Maternal grandmother with history of multiple sclerosis.  Physical Exam  BP 102/75   Pulse 71   Temp 98.6 F (37 C) (Oral)   Resp 16   Ht 5\' 6"  (1.676 m)   Wt 76 kg   SpO2 99%   BMI 27.04 kg/m   Physical Exam Vitals and nursing note reviewed.  Constitutional:      General: She is not in acute distress. HENT:     Head: Normocephalic and atraumatic.  Eyes:     General: No scleral icterus.    Extraocular Movements: Extraocular movements intact.  Pulmonary:     Effort: Pulmonary effort is normal. No respiratory distress.  Skin:    General: Skin is warm and dry.     Findings: No rash.  Neurological:     General: No focal deficit present.     Mental Status: She is alert and oriented to person, place, and time.  Psychiatric:        Mood and Affect: Mood normal.        Behavior: Behavior normal.        Thought Content: Thought content normal.        Judgment: Judgment normal.    Procedures  Procedures  ED Course / MDM   Clinical Course as of 04/11/22 0627  Thu Apr 11, 2022  Apr 13, 2022 Consult to Dr. [id number], neurologist, who recommends MR brain w/wo contrast. I appreciate his collaboration in the care of this patient.  [RS]  0521 Patient in MRI.  [RS]    Clinical Course User Index [RS] 0522 Santa Lighter, PA-C   Medical Decision Making Amount and/or Complexity of Data Reviewed Radiology: ordered.  Risk Prescription drug management. Decision regarding hospitalization.  This patient presents to the ED for concern of extremity weakness, this involves an extensive number of treatment options, and is a complaint that carries with  it a high risk of complications and morbidity.  The differential diagnosis includes cauda equina, demyelinating disease, radiculopathy, fracture or dislocation, etc.   Co morbidities that complicate the patient evaluation None   Additional history obtained:  Additional history obtained from: Parents at bedside External records from outside source obtained and reviewed including: None available  EKG: Not indicated  Cardiac Monitoring: Not indicated  Lab Results: I personally ordered, reviewed, and interpreted labs. Pertinent results include: CMP negative CBC negative CK negative Not pregnant UA negative  Imaging Studies ordered:  I ordered imaging studies which included MRI.  I independently reviewed & interpreted imaging & am in agreement with radiology impression. Imaging shows: MR Brain IMPRESSION:  1. Abnormal cerebral white matter signal, and evidence of abnormal  Cervical Spinal Cord signal at the C2 level, has a configuration  most suggestive of demyelinating disease.  Possible subacute demyelination in the right hemisphere, but no  enhancing lesions.  MR L Spine IMPRESSION:  Negative MRI appearance of the Lumbar Spine, but evidence of  multifocal lower thoracic Spinal Cord lesions suspicious for chronic  demyelinating in light of Brain MRI findings today.   Medications  -I reviewed the patient's home medications and did not make adjustments. -I did not prescribe new home medications.  Tests Considered: MR C and T-spine, neurology deferred  Critical Interventions: Neurology consult  Consultations: I requested consultation with the neurologist Dr. Otelia Limes @ 0730,  and discussed lab and imaging findings as well as pertinent plan - they recommend: hospitalist admission and continued work up. Does not recommend steroids at this time until he assesses patient.  0800: Spoke with Dr. Katrinka Blazing, hospitalist, who agrees to admission.  SDH None identified  ED  Course:  21 year old female who presents emergency department with right lower extremity weakness and poor coordination with ambulating on the right leg.  Physical exam with focal right lower extremity weakness and abnormal gait. Labs as above She does have concerning MR findings of the brain and lower T-spine that is visible on the MRI L-spine.  She also has abnormal MRI brain and has abnormal findings of the upper cervical spine that is visible on the MRI brain.  These are all concerning for subacute demyelinating disease.  I reengaged with neurology and spoke with Dr. Otelia Limes who recommends hospital admission and ongoing work-up for demyelinating disease.  Additionally, does not recommend steroids or other imaging at this time until he is able to assess the patient.  I consulted and spoke with Dr. Katrinka Blazing, hospitalist who agrees to admission.  I have updated the patient on the abnormal MRI and need for admission at this time.  I have answered all of her and her parents questions at this time. After consideration of the diagnostic results and the patients response to treatment, I feel that the patent would benefit from admission. The patient has been appropriately medically screened and/or stabilized in the ED.    Cristopher Peru, PA-C 04/11/22 0809    Melene Plan, DO 04/11/22 [phone]

## 2022-04-11 NOTE — Consult Note (Addendum)
Neurology Consultation  Reason for Consult: Right Lower Weakness Referring Physician: Madelyn Flavors  CC: Right Lower Extremity Numbness  History is obtained from: Patient  HPI: Robin Mercer is a 21 y.o. female with no known past medical history. She does use marijuana on a daily basis and recently has been having increased anxiety. Patient is here with complaints of right lower extremity numbness. Patient's parents are at the bedside providing her medical information. Her mother is an Charity fundraiser and has been following her symptomology. They are originally from [place], [place]  and moved to [place], [place] in 2020. Patient states since the pandemic she has been c/o intermittent  tingling and stiffness of her RLE. Patient reports that she "would be walking and would experience RLE weakness", where she had to stop. After resting she would recover and resume her activity and this continued for a year. Patient is now in Nursing School and has had increased stress with her clinicals. Her mother states that in the last 6 months she has had increased frequency of her symptoms.  In April 2023 she was [facility] and after her evaluation, she was referred to [facility] and has an upcoming appointment in 05/30. 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 She reports having pins & Needles sensation and then weakness of her leg, which made ambulating difficult. She was having difficulty with her balance and at times dragging her right foot. Her mother also noticed the difference with ambulation. Due to sudden onset of numbness, she was brought to [facility] for evaluation. Patient also noted  that she has been having urinary hesitancy, which is also intermittent   She denies any falls, visual difficulty with double or blurry vision, headaches, fever, nausea, vomiting, recent illness, diarrhea, sob or chest pain.  Patient now evaluated in the ED with unremarkable lab work. She completed MRI Brain w/w/o contrast which reveals abnormal cerebral white matter signal and evidence of abnormal cervical spinal cord signal @ C2 level with configuration suggestive of demyelination in the right cerebral hemisphere and no enhancing lesions. The right hemisphere lesion does exhibit restricted diffusion, which can be seen with acute demyelinating lesions even in the absence of enhancement, per the literature.   Patient now admitted for observation and further workup.   ROS: Full ROS was performed and is negative except as noted in the HPI. Patient is able to provide medical information.  Past Medical History:  Diagnosis Date   Seasonal allergies        Family History  Problem Relation Age of Onset   Multiple sclerosis Maternal Great-grandmother      Social History:   reports that she has never smoked. She has never used smokeless tobacco. She reports that she does not currently use alcohol. She reports current drug use. Drug: Marijuana.  Medications  Current Facility-Administered Medications:    acetaminophen (TYLENOL) tablet 650 mg, 650 mg, Oral, Q6H PRN **OR** acetaminophen (TYLENOL) suppository 650 mg, 650 mg, Rectal, Q6H PRN, Smith, Rondell A, MD   albuterol (PROVENTIL) (2.5 MG/3ML) 0.083% nebulizer solution 2.5 mg, 2.5 mg, Nebulization, Q6H PRN, Katrinka Blazing, Rondell A, MD   cefTRIAXone (ROCEPHIN) 1 g in sodium chloride 0.9 % 100 mL IVPB, 1 g, Intravenous, Q24H, Smith, Rondell A, MD, Last Rate: 200 mL/hr at 04/11/22 1321, 1 g at 04/11/22 1321   enoxaparin (LOVENOX) injection 40 mg, 40 mg, Subcutaneous, Daily, Katrinka Blazing, Rondell  A, MD, 40 mg at 04/11/22 4332   hydrALAZINE (APRESOLINE) injection 10 mg, 10 mg, Intravenous, Q4H PRN, Katrinka Blazing, Rondell A, MD   hydrOXYzine (ATARAX) tablet 10 mg, 10 mg, Oral, TID PRN, Katrinka Blazing, Rondell A, MD   methylPREDNISolone sodium succinate (SOLU-MEDROL) 1,000 mg in sodium chloride 0.9 %  50 mL IVPB, 1,000 mg, Intravenous, Daily, Caryl Pina, MD  Current Outpatient Medications:    MAGNESIUM PO, Take 1 tablet by mouth daily., Disp: , Rfl:    Exam: Current vital signs: BP (!) 125/94   Pulse 73   Temp 98.6 F (37 C) (Oral)   Resp 17   Ht 5\' 6"  (1.676 m)   Wt 76 kg   SpO2 100%   BMI 27.04 kg/m  Vital signs in last 24 hours: Temp:  [98.6 F (37 C)] 98.6 F (37 C) (05/17 2055) Pulse Rate:  [60-81] 73 (05/18 1300) Resp:  [15-17] 17 (05/18 1159) BP: (96-126)/(62-94) 125/94 (05/18 1300) SpO2:  [99 %-100 %] 100 % (05/18 1300) Weight:  [76 kg] 76 kg (05/17 2109)  GENERAL: Awake, alert and oriented, pleasant, cooperative and NAD HEENT: - Normocephalic and atraumatic, dry mm, no LN++, no Thyromegally LUNGS - Clear to auscultation bilaterally with no wheezes CV - S1S2 RRR, no m/r/g, equal pulses bilaterally. ABDOMEN - Soft, nontender, nondistended with normoactive BS Ext: warm, well perfused, intact peripheral pulses, no edema  NEURO:  Mental Status: AA&Ox3 , follows commands and recognizes reason for hospitalization Language: speech is Clear, Naming, repetition, fluency, and comprehension intact. Cranial Nerves: PERRL 3 mm and brisk bilaterally.  EOMI, visual fields full, no facial asymmetry,no facial sensation deficit, hearing intact, tongue/uvula/soft palate midline, normal sternocleidomastoid and trapezius muscle strength. No evidence of tongue atrophy or fasciculations Motor: no drift or essential tremor             RUE 5/5   RLE 5-/5   Right Grip: 5/5             LUE  5/5   LLE  4+/5   Left Grip :  5/5 Also with increased latencies of motor responses to LLE in conjunction with increased LLE tone Sensation- Intact to light touch bilaterally Vibration: Decreased @ Right Toe Coordination: FTN intact bilaterally, no ataxia in BLE. Gait- Wide stance and leaning towards the left, in addition to decreased excursion length of left stride relative to right. Also with  spastic inversion of left foot while ambulating.     Labs I have reviewed labs in epic and the results pertinent to this consultation are:   CBC    Component Value Date/Time   WBC 7.3 04/10/2022 2134   RBC 4.70 04/10/2022 2134   HGB 13.6 04/10/2022 2134   HCT 40.8 04/10/2022 2134   PLT 313 04/10/2022 2134   MCV 86.8 04/10/2022 2134   MCH 28.9 04/10/2022 2134   MCHC 33.3 04/10/2022 2134   RDW 13.7 04/10/2022 2134    CMP     Component Value Date/Time   NA 139 04/10/2022 2134   K 4.0 04/10/2022 2134   CL 110 04/10/2022 2134   CO2 22 04/10/2022 2134   GLUCOSE 91 04/10/2022 2134   BUN 9 04/10/2022 2134   CREATININE 0.84 04/10/2022 2134   CALCIUM 9.4 04/10/2022 2134   PROT 7.3 04/10/2022 2134   ALBUMIN 3.8 04/10/2022 2134   AST 14 (L) 04/10/2022 2134   ALT 15 04/10/2022 2134   ALKPHOS 46 04/10/2022 2134   BILITOT 1.0 04/10/2022 2134   GFRNONAA >  60 04/10/2022 2134    Lipid Panel  No results found for: CHOL, TRIG, HDL, CHOLHDL, VLDL, LDLCALC, LDLDIRECT  Assessment: 21 year old female presenting with symptoms, signs and imaging findings most consistent with an acute MS exacerbation in the context of waxing and waning neurological deficits previously, suggestive of possible onset of demyelinating disease approximately 2-3 years ago. She states that she has never had visual symptoms.  - Exam reveals LLE mild weakness with spasticity and abnormal gait - MRI brain: Abnormal cerebral white matter signal, and evidence of abnormal cervical spinal cord signal at the C2 level, having a configuration most suggestive of demyelinating disease. There is possible subacute demyelination in the right hemisphere, but no enhancing lesions. - MRI lumbar spine: Negative MRI appearance of the lumbar spine, but with evidence of multifocal lower thoracic spinal cord lesions suspicious for chronic demyelination in light of brain MRI findings today - Above imaging studies personally reviewed.  -  Daily Marijuana use  Recommendations: - Neuro checks q6 hours - Start Solumedrol 1 gram IV Q day x 5 days - Monitor CBG, daily CBC and daily BMP while on steroids.  - Protonix 40 mg po q day - MRI Cervical Spine w/w/o Contrast - MRI Thoracic Spine w/w/o Contrast - PT/OT consultation to assess mobility - Bladder Scan Q shift - Endorses daily marijuana use. Use of this substance has been shown to be correlated with worse MS disability, per the literature. Recommend cessation of the use of marijuana.  - Outpatient follow up with [facility] has previously been scheduled  I have seen and examined the patient. I have formulated the assessment and recommendations. 21 year old female presenting with symptoms, signs and imaging findings most consistent with an acute MS exacerbation in the context of waxing and waning neurological deficits previously, suggestive of possible onset of demyelinating disease approximately 2-3 years ago. She states that she has never had visual symptoms. Exam reveals mild LLE weakness with spasticity and abnormal gait. Recommendations include a 5 day course of IV Solumedrol.    Camillo Flaming , PA-C Neurohospitalist [facility]  I have seen and examined the patient. I have formulated the assessment and recommendations. 21 year old female presenting with symptoms, signs and imaging findings most consistent with an acute MS exacerbation in the context of waxing and waning neurological deficits previously, suggestive of possible onset of demyelinating disease approximately 2-3 years ago. She states that she has never had visual symptoms. Exam reveals mild LLE weakness with spasticity and abnormal gait. Recommendations include a 5 day course of IV Solumedrol.  Electronically signed: Dr. Caryl Pina

## 2022-04-12 DIAGNOSIS — G35 Multiple sclerosis: Secondary | ICD-10-CM | POA: Diagnosis not present

## 2022-04-12 DIAGNOSIS — G379 Demyelinating disease of central nervous system, unspecified: Secondary | ICD-10-CM | POA: Diagnosis not present

## 2022-04-12 LAB — CBC
HCT: 40.1 % (ref 36.0–46.0)
Hemoglobin: 13.4 g/dL (ref 12.0–15.0)
MCH: 28.6 pg (ref 26.0–34.0)
MCHC: 33.4 g/dL (ref 30.0–36.0)
MCV: 85.7 fL (ref 80.0–100.0)
Platelets: 306 10*3/uL (ref 150–400)
RBC: 4.68 MIL/uL (ref 3.87–5.11)
RDW: 13.5 % (ref 11.5–15.5)
WBC: 8.1 10*3/uL (ref 4.0–10.5)
nRBC: 0 % (ref 0.0–0.2)

## 2022-04-12 LAB — BASIC METABOLIC PANEL
Anion gap: 6 (ref 5–15)
BUN: 8 mg/dL (ref 6–20)
CO2: 23 mmol/L (ref 22–32)
Calcium: 9.6 mg/dL (ref 8.9–10.3)
Chloride: 109 mmol/L (ref 98–111)
Creatinine, Ser: 0.76 mg/dL (ref 0.44–1.00)
GFR, Estimated: >60 mL/min (ref >60)
Glucose, Bld: 125 mg/dL — ABNORMAL HIGH (ref 70–99)
Potassium: 5 mmol/L (ref 3.5–5.1)
Sodium: 138 mmol/L (ref 135–145)

## 2022-04-12 LAB — GC/CHLAMYDIA PROBE AMP (~~LOC~~) NOT AT ARMC
Chlamydia: NEGATIVE
Comment: NEGATIVE
Comment: NORMAL
Neisseria Gonorrhea: NEGATIVE

## 2022-04-12 LAB — URINE CULTURE

## 2022-04-12 MED ORDER — PANTOPRAZOLE SODIUM 40 MG PO TBEC
40.0000 mg | DELAYED_RELEASE_TABLET | Freq: Every day | ORAL | Status: DC
  Administered 2022-04-12 – 2022-04-15 (×4): 40 mg via ORAL
  Filled 2022-04-12 (×4): qty 1

## 2022-04-12 NOTE — Progress Notes (Signed)
This nurse assuming care, pt laying in bed resting comfortably. Denies pain, discomfort, shortness of breath.  No acute distress noted.  Bed low, call light in reach.

## 2022-04-12 NOTE — Progress Notes (Addendum)
Patient mother and two other family members at bedside , mother called nurse to bedside wanted to know what the results of pt urinalysis was, patient was asked if the results were ok to give to her mother and others in the room at the time and pt agreed this was ok. Urine results read to patient and her mother and showed abnormal results , charge nurse called in to room due to mother wanting to know why the patient was still on antibiiotics when the doctor had told her that is the urine test was negative for infection that it would be discontinued. Charge nurse [place] gave results of urine culture final result to patient and her mother also as abnormal and possibly will need a recollect that was recommended on test results. Pt mother upset with nurse because nurse did not see positive nitrates and did not feel it was a positive result for infection but agreed it was abnormal. 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 Charge nurse [place] in room and agreed to [employer].

## 2022-04-12 NOTE — TOC Initial Note (Signed)
Transition of Care [facility]) - Initial/Assessment Note    Patient Details  Name: Wilba Mutz MRN: [id number] Date of Birth: [birth year]  [facility] CM/SW Contact:    Kermit Balo, RN Phone Number: 04/12/2022, 1:49 PM  Clinical Narrative:                 Patient is from home with family. No f/u per PT.  TOC following.  Expected Discharge Plan: Home/Self Care Barriers to Discharge: Continued Medical Work up   Patient Goals and CMS Choice        Expected Discharge Plan and Services Expected Discharge Plan: Home/Self Care       Living arrangements for the past 2 months: Single Family Home                                      Prior Living Arrangements/Services Living arrangements for the past 2 months: Single Family Home Lives with:: Parents Patient language and need for interpreter reviewed:: Yes Do you feel safe going back to the place where you live?: Yes            Criminal Activity/Legal Involvement Pertinent to Current Situation/Hospitalization: No - Comment as needed  Activities of Daily Living Home Assistive Devices/Equipment: Eyeglasses ADL Screening (condition at time of admission) Patient's cognitive ability adequate to safely complete daily activities?: Yes Is the patient deaf or have difficulty hearing?: No Does the patient have difficulty seeing, even when wearing glasses/contacts?: No Does the patient have difficulty concentrating, remembering, or making decisions?: No Patient able to express need for assistance with ADLs?: Yes Does the patient have difficulty dressing or bathing?: No Independently performs ADLs?: Yes (appropriate for developmental age) Does the patient have difficulty walking or climbing stairs?: No Weakness of Legs: Right Weakness of Arms/Hands: None  Permission Sought/Granted                  Emotional Assessment       Orientation: : Oriented to Self, Oriented to Place, Oriented to  Time,  Oriented to Situation   Psych Involvement: No (comment)  Admission diagnosis:  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 [place] [address] Phone: [phone] Fax: [phone]     Social Determinants of Health (SDOH) Interventions    Readmission Risk Interventions     View : No data to display.

## 2022-04-12 NOTE — Assessment & Plan Note (Signed)
-   Continue Solu-medrol 1g daily, day 3 of 5 - CBC, CBG, BMP and bladder scans while on high dose steroids - PPI

## 2022-04-12 NOTE — Evaluation (Signed)
Physical Therapy Evaluation Patient Details Name: Robin Mercer MRN: [id number] DOB: November 26, 2000 Today's Date: 04/12/2022  History of Present Illness  pt is a 21 y/o female presenting with numbness of her right leg which has been intermittent, but occuring more often lately. Work up continues, suspecting demyelinating ds, MS based on MRI showing brain and cord lesions.  PMHX marijuana use  Clinical Impression  Pt is at or close to baseline functioning and should be safe at home independent. There are no further acute PT needs.  Will sign off at this time.        Recommendations for follow up therapy are one component of a multi-disciplinary discharge planning process, led by the attending physician.  Recommendations may be updated based on patient status, additional functional criteria and insurance authorization.  Follow Up Recommendations No PT follow up    Assistance Recommended at Discharge PRN  Patient can return home with the following       Equipment Recommendations None recommended by PT  Recommendations for Other Services       Functional Status Assessment Patient has had a recent decline in their functional status and demonstrates the ability to make significant improvements in function in a reasonable and predictable amount of time.     Precautions / Restrictions        Mobility  Bed Mobility Overal bed mobility: Independent                  Transfers Overall transfer level: Independent                      Ambulation/Gait Ambulation/Gait assistance: Independent Gait Distance (Feet): 400 Feet Assistive device: None Gait Pattern/deviations: Step-through pattern   Gait velocity interpretation: >2.62 ft/sec, indicative of community ambulatory   General Gait Details: mildly uncoordinated steppage gait on the R LE with intermittent numbness/?spasm that un-nerves pt causing her to stop until episode is over, but does not appear to cause  significant safety issues.  Stairs Stairs: Yes Stairs assistance: Independent Stair Management: No rails, Alternating pattern, Forwards Number of Stairs: 2 General stair comments: safe on stairs with adequate coordination for safety.  Wheelchair Mobility    Modified Rankin (Stroke Patients Only)       Balance Overall balance assessment: Independent, No apparent balance deficits (not formally assessed)                               Standardized Balance Assessment Standardized Balance Assessment : Dynamic Gait Index   Dynamic Gait Index Level Surface: Normal Change in Gait Speed: Mild Impairment Gait and Pivot Turn: Normal Step Over Obstacle: Normal Steps: Normal       Pertinent Vitals/Pain Pain Assessment Pain Assessment: No/denies pain    Home Living Family/patient expects to be discharged to:: Private residence Living Arrangements: Parent Available Help at Discharge: Family;Available PRN/intermittently Type of Home: House           Home Equipment: None      Prior Function Prior Level of Function : Independent/Modified Independent (student)                     Hand Dominance        Extremity/Trunk Assessment   Upper Extremity Assessment Upper Extremity Assessment: Overall WFL for tasks assessed    Lower Extremity Assessment Lower Extremity Assessment: Overall WFL for tasks assessed;LLE deficits/detail LLE Deficits / Details: weakness distally, df  3-, hams 3+/5 LLE Sensation: decreased light touch LLE Coordination: decreased fine motor       Communication   Communication: No difficulties  Cognition Arousal/Alertness: Awake/alert Behavior During Therapy: WFL for tasks assessed/performed Overall Cognitive Status: Within Functional Limits for tasks assessed                                          General Comments General comments (skin integrity, edema, etc.): pt reports subjective improvements after 1  dose of steroids.    Exercises     Assessment/Plan    PT Assessment Patient does not need any further PT services  PT Problem List         PT Treatment Interventions      PT Goals (Current goals can be found in the Care Plan section)  Acute Rehab PT Goals Patient Stated Goal: figure out what I'm dealing with. PT Goal Formulation: All assessment and education complete, DC therapy    Frequency       Co-evaluation               AM-PAC PT "6 Clicks" Mobility  Outcome Measure Help needed turning from your back to your side while in a flat bed without using bedrails?: None Help needed moving from lying on your back to sitting on the side of a flat bed without using bedrails?: None Help needed moving to and from a bed to a chair (including a wheelchair)?: None Help needed standing up from a chair using your arms (e.g., wheelchair or bedside chair)?: None Help needed to walk in hospital room?: None   6 Click Score: 20    End of Session   Activity Tolerance: Patient tolerated treatment well Patient left: with call bell/phone within reach Nurse Communication: Mobility status PT Visit Diagnosis: Other abnormalities of gait and mobility (R26.89)    Time: 1610-9604 PT Time Calculation (min) (ACUTE ONLY): 21 min   Charges:   PT Evaluation $PT Eval Low Complexity: 1 Low          04/12/2022  Jacinto Halim., PT Acute Rehabilitation Services [phone]  (pager) [phone]  (office)  Eliseo Gum Lequisha Cammack 04/12/2022, 10:03 AM

## 2022-04-12 NOTE — Progress Notes (Signed)
Subjective: No complaints. Has not yet received second dose of IV steroids.   Objective: Current vital signs: BP 106/63 (BP Location: Left Arm)   Pulse 67   Temp (!) 97.5 F (36.4 C) (Oral)   Resp 16   Ht  (1.676 m)   Wt 76 kg   SpO2 95%   BMI 27.04 kg/m  Vital signs in last 24 hours: Temp:  [97.5 F (36.4 C)-98.6 F (37 C)] 97.5 F (36.4 C) (05/19 0329) Pulse Rate:  [52-84] 67 (05/19 0329) Resp:  [16-18] 16 (05/19 0329) BP: (103-125)/(61-94) 106/63 (05/19 0329) SpO2:  [95 %-100 %] 95 % (05/19 0329)  Intake/Output from previous day: 05/18 0701 - 05/19 0700 In: 643.5 [P.O.:480; IV Piggyback:163.5] Out: -  Intake/Output this shift: No intake/output data recorded. Nutritional status:  Diet Order             Diet regular Room service appropriate? Yes; Fluid consistency: Thin  Diet effective now                  HEENT: [place]/AT Lungs: Respirations unlabored Ext: No edema  Neurologic Exam: Ment: Intact to complex questions and commands.  CN: Fixates and tracks normally. Face symmetric.  Motor: RUE and RLE 5/5 LUE 5/5 LLE 4/5 proximally and distally Sensory: Unchanged Gait: Unchanged antalgic gait.   Lab Results: Results for orders placed or performed during the hospital encounter of 04/10/22 (from the past 48 hour(s))  Urinalysis, Routine w reflex microscopic Urine, Clean Catch     Status: Abnormal   Collection Time: 04/10/22  9:22 PM  Result Value Ref Range   Color, Urine YELLOW YELLOW   APPearance HAZY (A) CLEAR   Specific Gravity, Urine 1.031 (H) 1.005 - 1.030   pH 5.0 5.0 - 8.0   Glucose, UA NEGATIVE NEGATIVE mg/dL   Hgb urine dipstick NEGATIVE NEGATIVE   Bilirubin Urine NEGATIVE NEGATIVE   Ketones, ur 80 (A) NEGATIVE mg/dL   Protein, ur 30 (A) NEGATIVE mg/dL   Nitrite NEGATIVE NEGATIVE   Leukocytes,Ua MODERATE (A) NEGATIVE   RBC / HPF 0-5 0 - 5 RBC/hpf   WBC, UA 0-5 0 - 5 WBC/hpf   Bacteria, UA RARE (A) NONE SEEN   Squamous Epithelial / LPF  0-5 0 - 5   Mucus PRESENT     Comment: Performed at [facility], [address] [place], [place] [address]  CK     Status: None   Collection Time: 04/10/22  9:34 PM  Result Value Ref Range   Total CK 71 38 - 234 U/L    Comment: Performed at [facility], [address], [place], [place] [address]  Comprehensive metabolic panel     Status: Abnormal   Collection Time: 04/10/22  9:34 PM  Result Value Ref Range   Sodium 139 135 - 145 mmol/L   Potassium 4.0 3.5 - 5.1 mmol/L   Chloride 110 98 - 111 mmol/L   CO2 22 22 - 32 mmol/L   Glucose, Bld 91 70 - 99 mg/dL    Comment: Glucose reference range applies only to samples taken after fasting for at least 8 hours.   BUN 9 6 - 20 mg/dL   Creatinine, Ser 0.98 0.44 - 1.00 mg/dL   Calcium 9.4 8.9 - 11.9 mg/dL   Total Protein 7.3 6.5 - 8.1 g/dL   Albumin 3.8 3.5 - 5.0 g/dL   AST 14 (L) 15 - 41 U/L   ALT 15 0 -  44 U/L   Alkaline Phosphatase 46 38 - 126 U/L   Total Bilirubin 1.0 0.3 - 1.2 mg/dL   GFR, Estimated >09 >81 mL/min    Comment: (NOTE) Calculated using the CKD-EPI Creatinine Equation (2021)    Anion gap 7 5 - 15    Comment: Performed at [facility], [address], [place], [place] [address]  CBC     Status: None   Collection Time: 04/10/22  9:34 PM  Result Value Ref Range   WBC 7.3 4.0 - 10.5 K/uL   RBC 4.70 3.87 - 5.11 MIL/uL   Hemoglobin 13.6 12.0 - 15.0 g/dL   HCT 82.9 56.2 - 13.0 %   MCV 86.8 80.0 - 100.0 fL   MCH 28.9 26.0 - 34.0 pg   MCHC 33.3 30.0 - 36.0 g/dL   RDW 86.5 78.4 - 69.6 %   Platelets 313 150 - 400 K/uL   nRBC 0.0 0.0 - 0.2 %    Comment: Performed at [facility], [address] [place], [place] [address]  I-Stat beta hCG blood, ED ([facility], [facility], [facility] only)     Status: None   Collection Time: 04/10/22  9:49 PM  Result Value Ref Range   I-stat hCG, quantitative <5.0 <5 mIU/mL   Comment 3            Comment:   GEST. AGE      CONC.  (mIU/mL)   <=1 WEEK        5 - 50     2 WEEKS       50  - 500     3 WEEKS       100 - 10,000     4 WEEKS     1,000 - 30,000        FEMALE AND NON-PREGNANT FEMALE:     LESS THAN 5 mIU/mL   HIV Antibody (routine testing w rflx)     Status: None   Collection Time: 04/11/22  3:18 PM  Result Value Ref Range   HIV Screen 4th Generation wRfx Non Reactive Non Reactive    Comment: Performed at [facility], [address], [place], [place] [address]  TSH     Status: None   Collection Time: 04/11/22  3:18 PM  Result Value Ref Range   TSH 0.937 0.350 - 4.500 uIU/mL    Comment: Performed by a 3rd Generation assay with a functional sensitivity of <=0.01 uIU/mL. Performed at [facility], [address], [place], [place] [address]   CBC     Status: None   Collection Time: 04/12/22  3:03 AM  Result Value Ref Range   WBC 8.1 4.0 - 10.5 K/uL   RBC 4.68 3.87 - 5.11 MIL/uL   Hemoglobin 13.4 12.0 - 15.0 g/dL   HCT 72.5 36.6 - 44.0 %   MCV 85.7 80.0 - 100.0 fL   MCH 28.6 26.0 - 34.0 pg   MCHC 33.4 30.0 - 36.0 g/dL   RDW 34.7 42.5 - 95.6 %   Platelets 306 150 - 400 K/uL   nRBC 0.0 0.0 - 0.2 %    Comment: Performed at [facility], [address] [place], [place] [address]  Basic metabolic panel     Status: Abnormal   Collection Time: 04/12/22  3:03 AM  Result Value Ref Range   Sodium 138 135 - 145 mmol/L   Potassium 5.0 3.5 - 5.1 mmol/L   Chloride  109 98 - 111 mmol/L   CO2 23 22 - 32 mmol/L   Glucose, Bld 125 (H) 70 - 99 mg/dL    Comment: Glucose reference range applies only to samples taken after fasting for at least 8 hours.   BUN 8 6 - 20 mg/dL   Creatinine, Ser 0.96 0.44 - 1.00 mg/dL   Calcium 9.6 8.9 - 28.3 mg/dL   GFR, Estimated >66 >29 mL/min    Comment: (NOTE) Calculated using the CKD-EPI Creatinine Equation (2021)    Anion gap 6 5 - 15    Comment: Performed at [facility], [address] [place], [place] [address]    No results found for this or any previous visit (from the past 240 hour(s)).  Lipid  Panel No results for input(s): CHOL, TRIG, HDL, CHOLHDL, VLDL, LDLCALC in the last 72 hours.  Studies/Results: MR Brain W and Wo Contrast  Result Date: 04/11/2022 CLINICAL DATA:  21 year old female with intermittent numbness and weakness. Impaired coordination of the right leg. EXAM: MRI HEAD WITHOUT AND WITH CONTRAST TECHNIQUE: Multiplanar, multiecho pulse sequences of the brain and surrounding structures were obtained without and with intravenous contrast. CONTRAST:  61mL GADAVIST GADOBUTROL 1 MMOL/ML IV SOLN COMPARISON:  None Available. FINDINGS: Brain: There is abnormal T2 hyperintensity and volume loss in the C1-C2 spinal cord visible on the 1st axial T2 image series 10, image 1. No evidence of associated enhancement there. Round and nodular abnormal bilateral cerebral white matter T2 and FLAIR hyperintensity, most pronounced in the right centrum semi of bowel (series 11, image 21) with abnormal diffusion although mostly T2 shine through (series 5, image 96). No associated enhancement. A right periatrial white matter lesion also has mildly abnormal diffusion but no enhancement. Temporal lobes appear spared. Deep gray nuclei, brainstem and cerebellum appear spared. Subcortical white matter involvement but no cortical involvement or encephalomalacia. No chronic cerebral blood products. No abnormal enhancement identified. No restricted diffusion suggestive of acute infarction. No midline shift, mass effect, evidence of mass lesion, ventriculomegaly, extra-axial collection or acute intracranial hemorrhage. Cervicomedullary junction and pituitary are within normal limits. No dural thickening. Cystic change to the pineal gland (series 11, image 14 and series 9, image 14) with no suspicious enhancement or regional mass effect. Vascular: Major intracranial vascular flow voids are preserved. The major dural venous sinuses are enhancing and appear to be patent. Skull and upper cervical spine: Visualized bone marrow  signal is within normal limits. Abnormal C2 cervical spinal cord T2 hyperintensity as above, but grossly normal spinal cord appearance on sagittal T1 imaging. Sinuses/Orbits: Grossly symmetric and normal orbits. Paranasal sinuses and mastoids are stable and well aerated. Other: Visible internal auditory structures appear normal. Negative visible scalp and face. IMPRESSION: 1. Abnormal cerebral white matter signal, and evidence of abnormal Cervical Spinal Cord signal at the C2 level, has a configuration most suggestive of demyelinating disease. Possible subacute demyelination in the right hemisphere, but no enhancing lesions. 2. No other acute intracranial abnormality. Electronically Signed   By: Odessa Fleming M.D.   On: 04/11/2022 06:49   MR Lumbar Spine W Wo Contrast  Result Date: 04/11/2022 CLINICAL DATA:  21 year old female with intermittent numbness and weakness. Impaired coordination of the right leg. EXAM: MRI LUMBAR SPINE WITHOUT AND WITH CONTRAST TECHNIQUE: Multiplanar and multiecho pulse sequences of the lumbar spine were obtained without and with intravenous contrast. CONTRAST:  58mL GADAVIST GADOBUTROL 1 MMOL/ML IV SOLN COMPARISON:  Brain MRI today reported separately. FINDINGS: Segmentation: Lumbar segmentation appears  to be normal and will be designated as such for this report. Alignment: Normal lumbar lordosis. There is subtle levoconvex lumbar scoliosis. No spondylolisthesis. Vertebrae: No marrow edema or evidence of acute osseous abnormality. Visualized bone marrow signal is within normal limits. Intact visible sacrum. Conus medullaris and cauda equina: Conus extends to the T12-L1 Signal in the conus appears normal, but there is subtle STIR and axial T2 imaging evidence of abnormal lower thoracic spinal cord lesions at T10 and T11/T12 (series 14, image 8 and series 16, image 7). No evidence of abnormal lower spinal cord or conus enhancement. Cauda equina nerve roots appear normal. No abnormal lumbar  intradural enhancement. No dural thickening. Level. Conus and cauda equina appear normal. Paraspinal and other soft tissues: Negative. Disc levels: Essentially negative. Capacious spinal canal. Normal intervertebral disc signal and morphology. There is mild right side lower lumbar posterior element hypertrophy. IMPRESSION: Negative MRI appearance of the Lumbar Spine, but evidence of multifocal lower thoracic Spinal Cord lesions suspicious for chronic demyelinating in light of Brain MRI findings today. Electronically Signed   By: Odessa Fleming M.D.   On: 04/11/2022 06:53    Medications: Scheduled:  enoxaparin (LOVENOX) injection  40 mg Subcutaneous Daily   Continuous:  cefTRIAXone (ROCEPHIN)  IV Stopped (04/11/22 1405)   methylPREDNISolone (SOLU-MEDROL) injection Stopped (04/11/22 1558)   Assessment: 21 year old female presenting with symptoms, signs and imaging findings most consistent with an acute MS exacerbation in the context of waxing and waning neurological deficits previously, suggestive of possible onset of demyelinating disease approximately 2-3 years ago. She states that she has never had visual symptoms.  - Exam today is unchanged relative to yesterday, with mild LLE weakness in conjunction with spasticity and antalgic gait - MRI brain: Abnormal cerebral white matter signal, and evidence of abnormal cervical spinal cord signal at the C2 level, having a configuration most suggestive of demyelinating disease. There is possible subacute demyelination in the right hemisphere, but no enhancing lesions. - MRI lumbar spine: Negative MRI appearance of the lumbar spine, but with evidence of multifocal lower thoracic spinal cord lesions suspicious for chronic demyelination in light of brain MRI findings today - MRI of cervical and thoracic spine with and without contrast is pending - Daily Marijuana use   Recommendations: - Neuro checks q6 hours - Today is day 2/5 of IV Solumedrol (1 gram IV Q day) -  Monitor CBG, daily CBC and daily BMP while on steroids.  - Protonix 40 mg po q day - MRI Cervical and Thoracic Spine w/wo contrast - pending - PT/OT consultation to assess mobility - Bladder Scan Q shift - Endorses daily marijuana use. Use of this substance has been shown to be correlated with worse MS disability, per the literature. Recommend cessation of the use of marijuana.  - Outpatient follow up with [facility] has previously been scheduled   LOS: 1 day   @Electronically  signed: Dr. 04/12/2022  7:45 AM

## 2022-04-12 NOTE — Hospital Course (Signed)
Robin Mercer is a 21 y.o. F with no significant PMHx who presented with right leg numbness in the setting of pre-existing right foot drop.  In the ER, MRI brain and spinal cord showed enhancing lesions suggestive of Robin.

## 2022-04-12 NOTE — Progress Notes (Signed)
  Progress Note   Patient: Robin Mercer [id number] DOB: 2001-10-30 DOA: 04/10/2022     1 DOS: the patient was seen and examined on 04/12/2022 at 9:30AM      Brief hospital course: Robin Mercer is a 21 y.o. F with no significant PMHx who presented with right leg numbness in the setting of pre-existing right foot drop.  In the ER, MRI brain and spinal cord showed enhancing lesions suggestive of Robin.     Assessment and Plan: * Multiple sclerosis (HCC) - Continue Solu-medrol 1g daily, day 2 of 5 - CBC, CBG, BMP and bladder scans while on high dose steroids - PPI          Subjective: Patient feels well, she has no headache, chest pain, dyspnea.  She still has numbness in the right leg.     Physical Exam: Vitals:   04/12/22 0329 04/12/22 0800 04/12/22 0819 04/12/22 1224  BP: 106/63  109/60 (!) 105/53  Pulse: 67  80 67  Resp: 16  16 16   Temp: (!) 97.5 F (36.4 C)  98.1 F (36.7 C) 98 F (36.7 C)  TempSrc: Oral  Oral Oral  SpO2: 95% 100% 100% 100%  Weight:      Height:       Well-nourished adult female, sitting in the edge of the bed, no acute distress RRR, no murmurs, no peripheral edema Respiratory rate normal, lungs clear without rales or wheezes She has decreased sensation in the right leg, strength is normal in both upper and lower extremities bilateral, speech fluent, face symmetric  Data Reviewed: MRI reviewed, BMP and CBC reviewed       Disposition: Status is: Inpatient Remains inpatient appropriate because: she will require 5 days steroids        Author: Edwin Dada, MD 04/12/2022 3:06 PM  For on call review [email or web address].

## 2022-04-12 NOTE — Progress Notes (Addendum)
Pt to transfer to 5W, awaiting bed assignment.

## 2022-04-12 NOTE — Progress Notes (Addendum)
Pt transferred to 5W in stable condition, report given at bedside to [facility], [place]

## 2022-04-13 ENCOUNTER — Inpatient Hospital Stay (HOSPITAL_COMMUNITY): Payer: PRIVATE HEALTH INSURANCE

## 2022-04-13 DIAGNOSIS — G379 Demyelinating disease of central nervous system, unspecified: Secondary | ICD-10-CM | POA: Diagnosis not present

## 2022-04-13 DIAGNOSIS — G35 Multiple sclerosis: Secondary | ICD-10-CM | POA: Diagnosis not present

## 2022-04-13 DIAGNOSIS — R8281 Pyuria: Secondary | ICD-10-CM | POA: Insufficient documentation

## 2022-04-13 IMAGING — MR MR THORACIC SPINE WO/W CM
5 of 9 series · 19 of 48 positions shown · IV contrast (gadavist)
Comparison: None Available.

CLINICAL DATA: Myelopathy, acute, thoracic spine

EXAM:
MRI THORACIC WITHOUT AND WITH CONTRAST
TECHNIQUE: Multiplanar and multiecho pulse sequences of the thoracic spine were
obtained without and with intravenous contrast.
CONTRAST:  7.6mL GADAVIST GADOBUTROL 1 MMOL/ML IV SOLN

[Series 8: T1 · sagittal · 3.0mm · 0.90mm/px · 1 of 12 slices shown (1 of 3)]
[im 1/12]
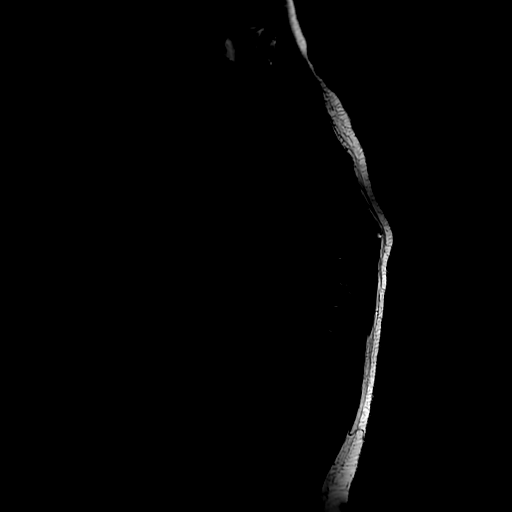

[Series 10: T2 · sagittal · 3.0mm · 0.59mm/px · 2 of 15 slices shown (1 of 2)]
[im 1/15]
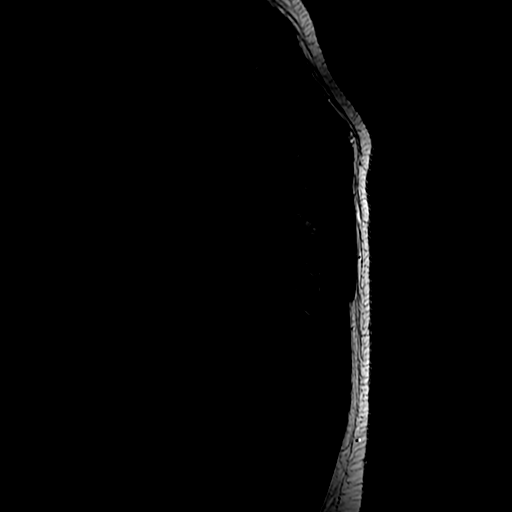
[im 15/15]
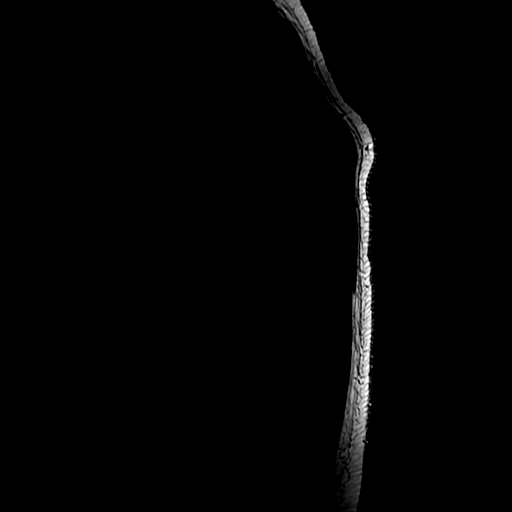

[Series 12: T1 · sagittal · 3.0mm · 0.59mm/px · 3 of 15 slices shown (2 of 3)]
[im 1/15]
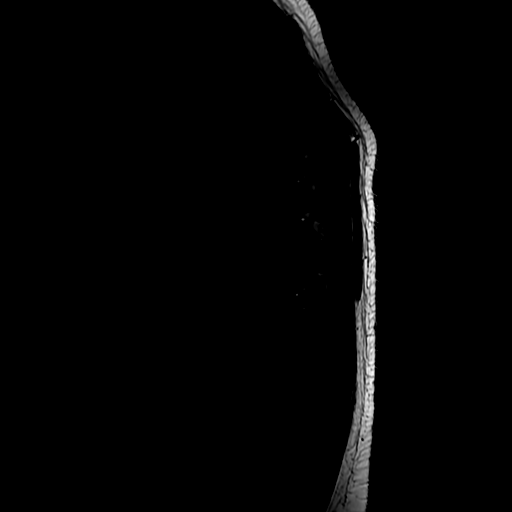
[im 8/15]
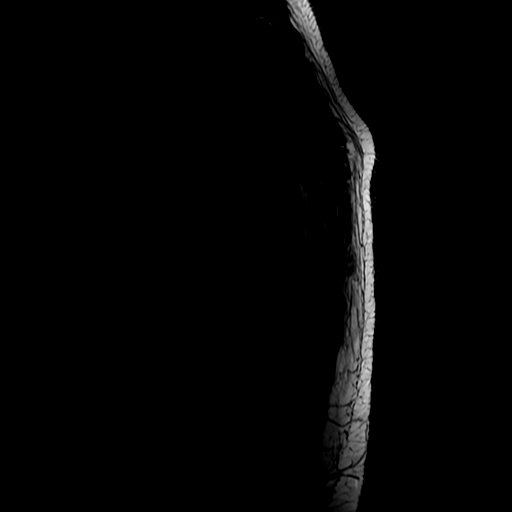
[im 15/15]
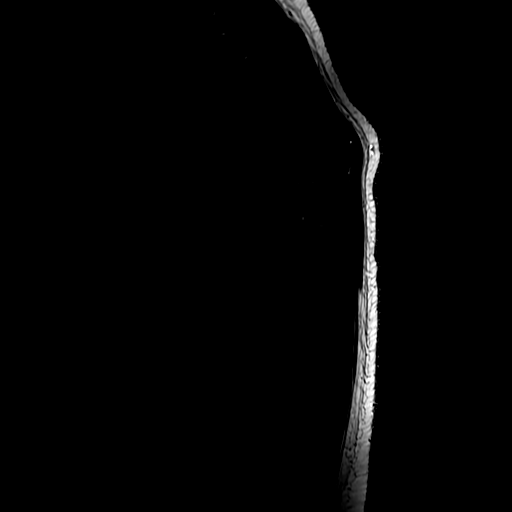

[Series 13: T2 · axial · 4.0mm · 0.39mm/px · z∈[-315,-119]mm · 7 of 39 slices shown (2 of 2)]
[im 1/39]
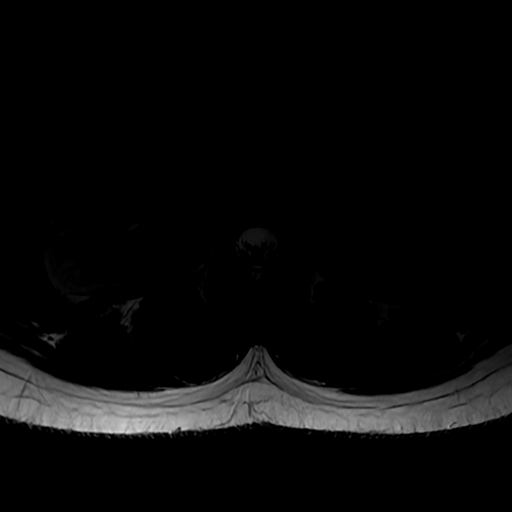
[im 7/39]
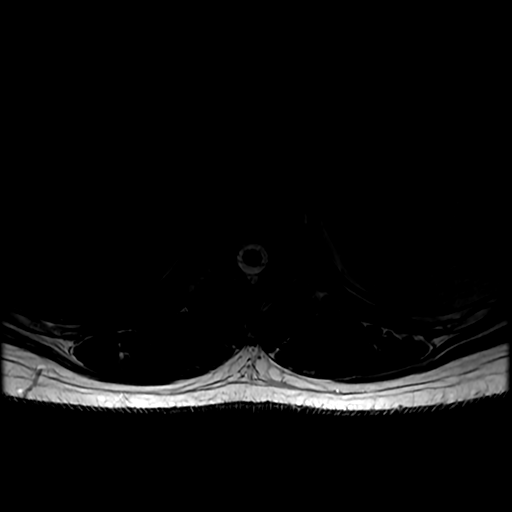
[im 13/39]
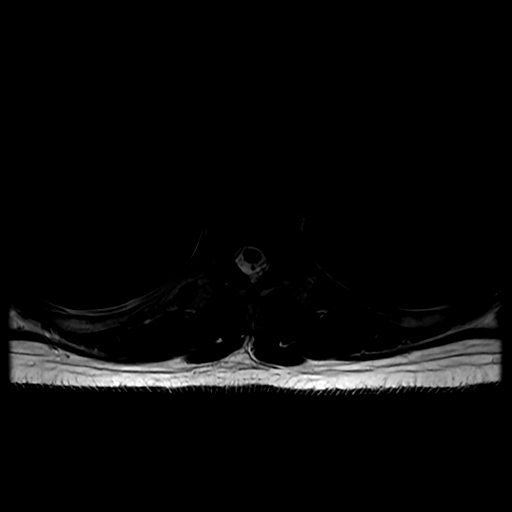
[im 20/39]
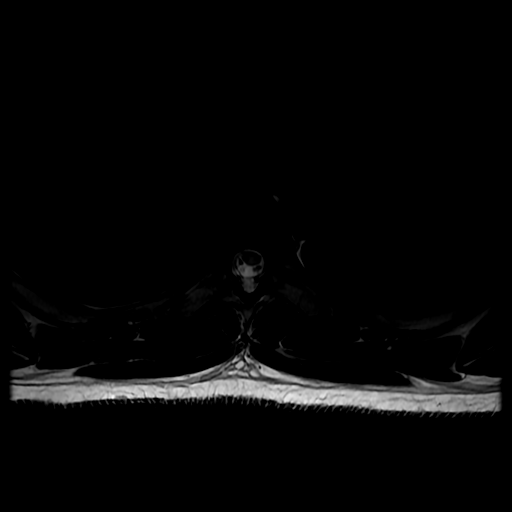
[im 26/39]
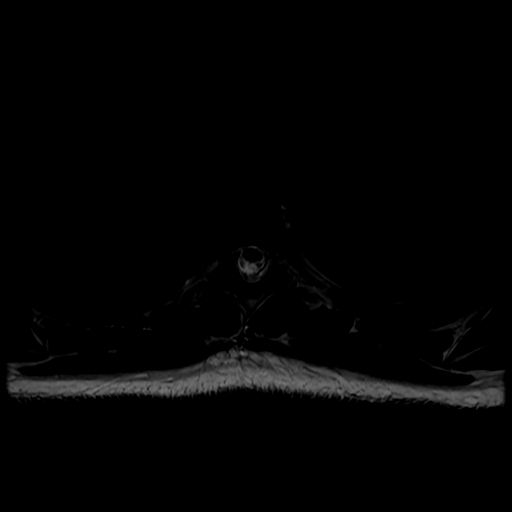
[im 32/39]
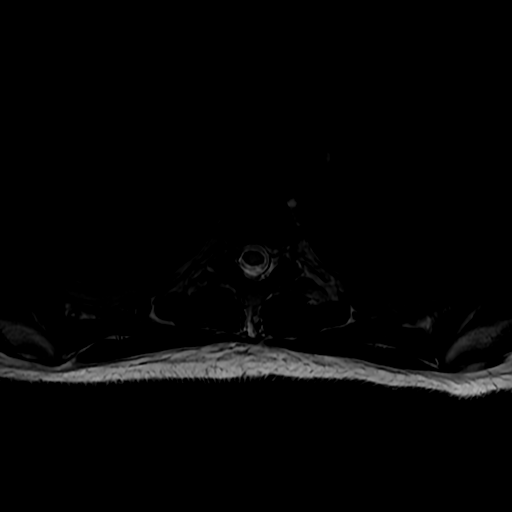
[im 39/39]
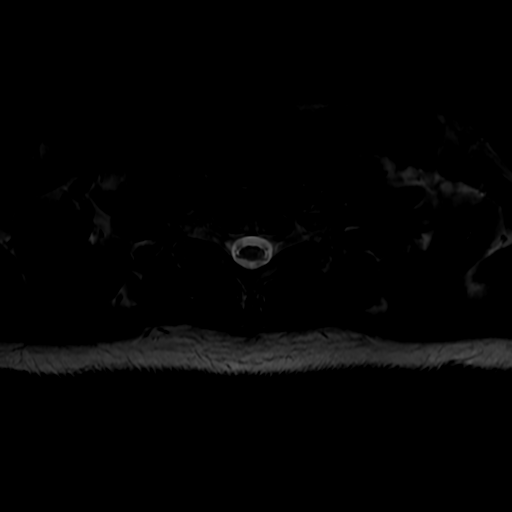

[Series 15: T1 · axial · non-contrast · 3.0mm · 0.39mm/px · z∈[-316,-176]mm · 6 of 65 slices shown (3 of 3)]
[im 1/65]
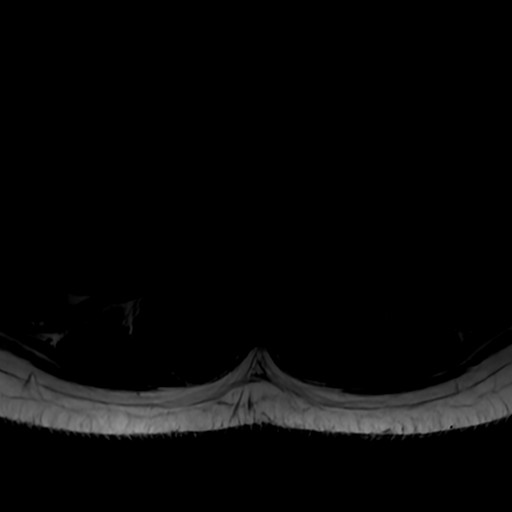
[im 13/65]
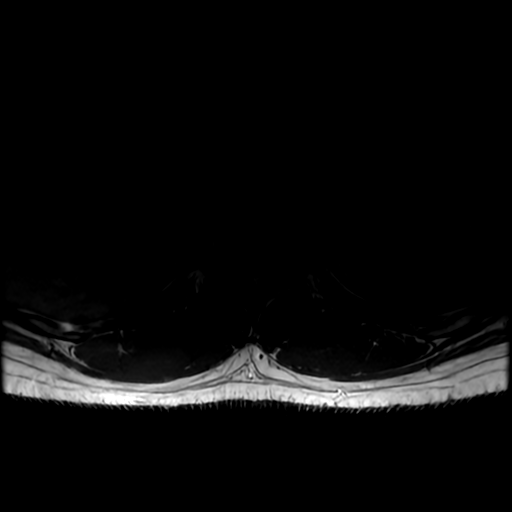
[im 20/65]
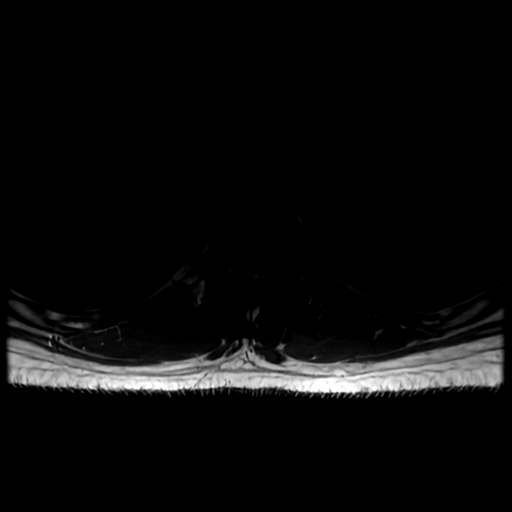
[im 26/65]
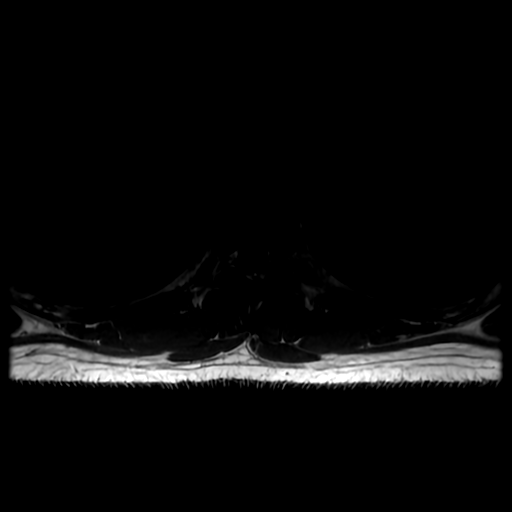
[im 39/65]
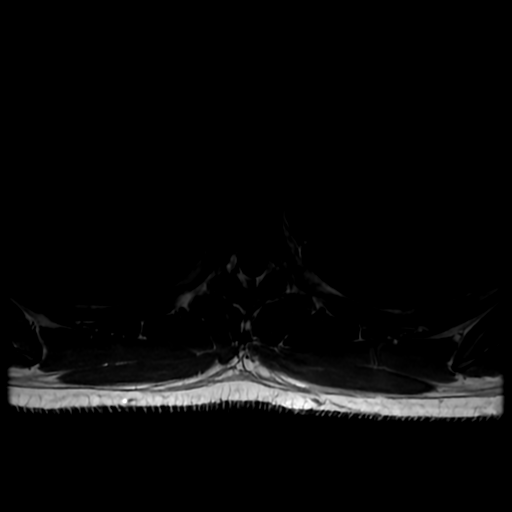
[im 45/65]
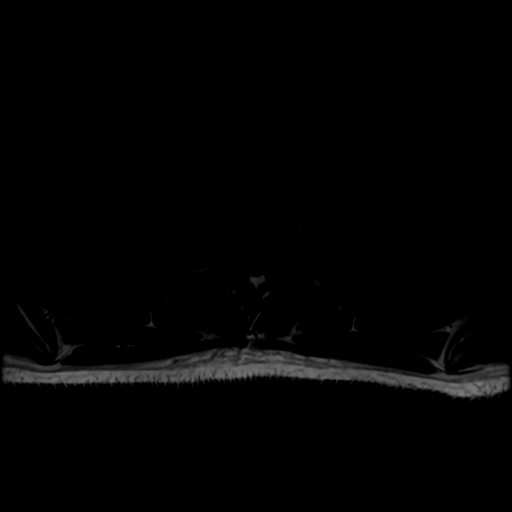

[19 of 48 positions shown; findings below may reference images not displayed]

FINDINGS: Alignment:  Physiologic.

Vertebrae: No fracture, evidence of discitis, or bone lesion.

Cord: Multifocal T2/STIR hyperintense cord lesions throughout the
thoracic spine, each measuring between 1.0 and 2.0 cm in length
(series 11, image 8). Individual lesions are centered at the level
of the T4 vertebral body, T7 vertebral body, T10 vertebral body, and
T12 vertebral body. No enhancement within any of the lesions on
postcontrast sequences. No cord expansion.

Paraspinal and other soft tissues: Unremarkable.

Disc levels:

Negative. Intervertebral discs of the thoracic spine are maintained.
No focal disc protrusion. Unremarkable facet joints. No foraminal or
canal stenosis at any level.
IMPRESSION: Multifocal T2/STIR hyperintense cord lesions throughout the thoracic
spine. Findings are most consistent with demyelinating disease. No
associated enhancement on postcontrast sequences to suggest active
demyelination at this time.

## 2022-04-13 IMAGING — MR MR CERVICAL SPINE WO/W CM
5 of 8 series · 19 of 48 positions shown · IV contrast (gadavist)
Comparison: MRI head [DATE]

CLINICAL DATA: Myelopathy, acute, cervical spine

EXAM:
MRI CERVICAL SPINE WITHOUT AND WITH CONTRAST
TECHNIQUE: Multiplanar and multiecho pulse sequences of the cervical spine, to
include the craniocervical junction and cervicothoracic junction,
were obtained without and with intravenous contrast.
CONTRAST:  7.6mL GADAVIST GADOBUTROL 1 MMOL/ML IV SOLN

[Series 2: T2 · sagittal · 3.0mm · 0.43mm/px · 3 of 15 slices shown (1 of 2)]
[im 1/15]
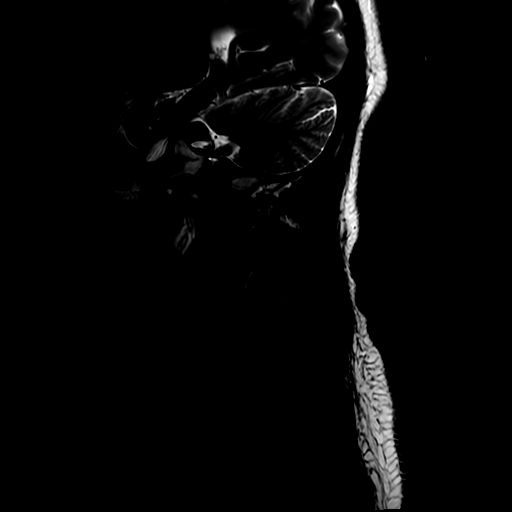
[im 8/15]
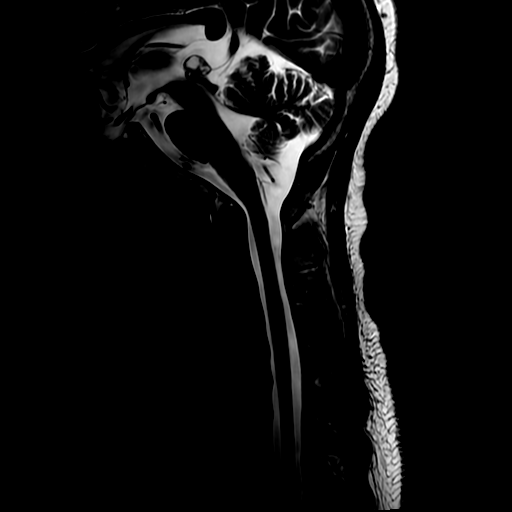
[im 15/15]
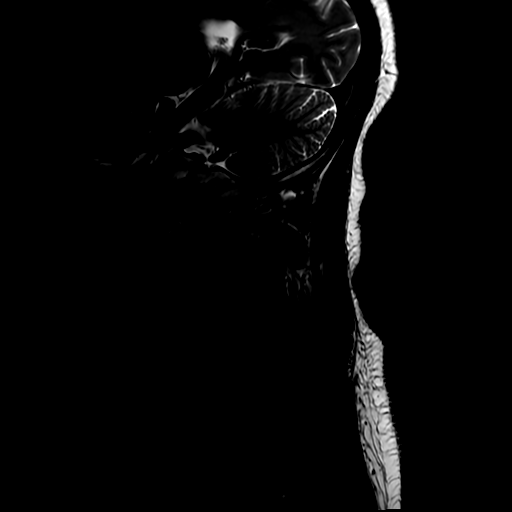

[Series 4: STIR · sagittal · 3.0mm · 0.43mm/px · 1 of 15 slices shown]
[im 1/15]
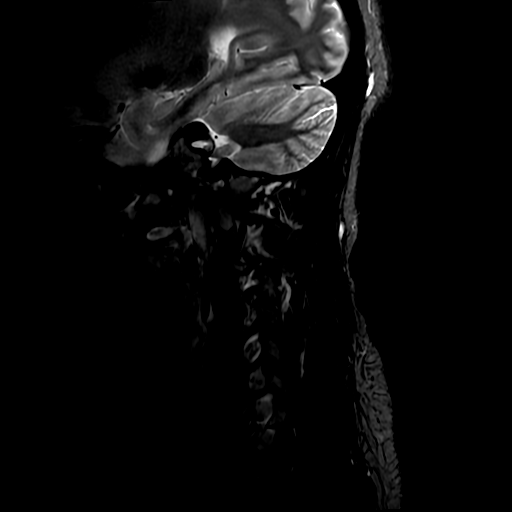

[Series 6: T2 · axial · 3.0mm · 0.35mm/px · z∈[-133,-31]mm · 6 of 33 slices shown (2 of 2)]
[im 1/33]
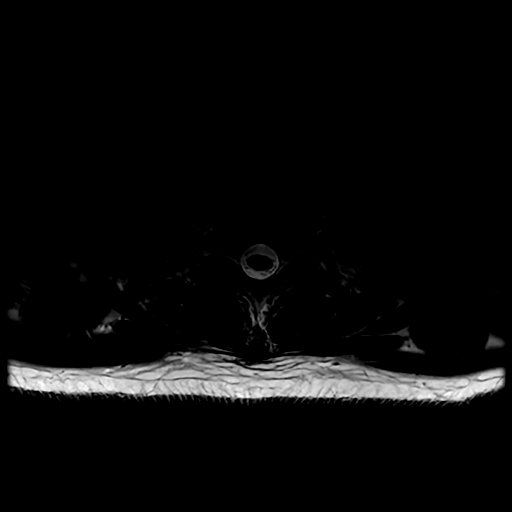
[im 7/33]
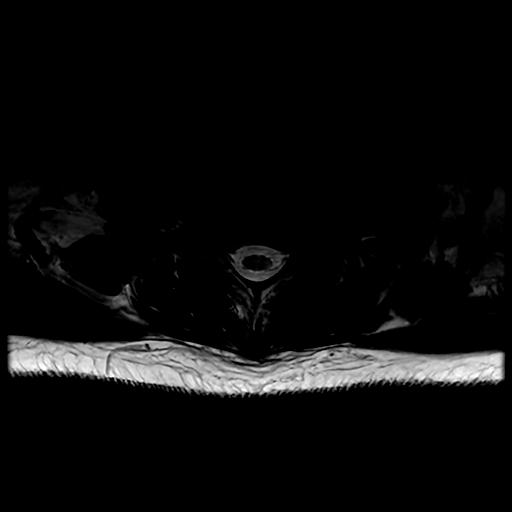
[im 13/33]
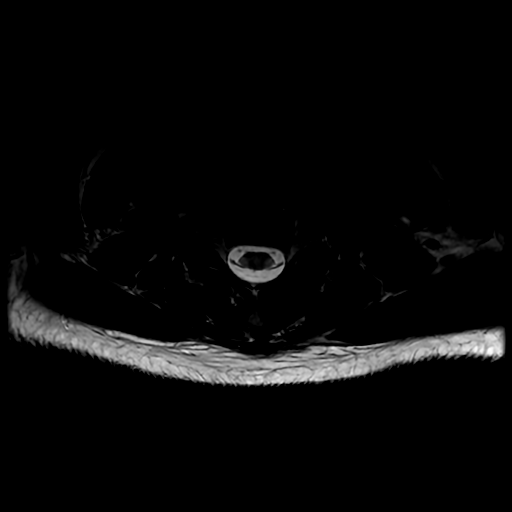
[im 20/33]
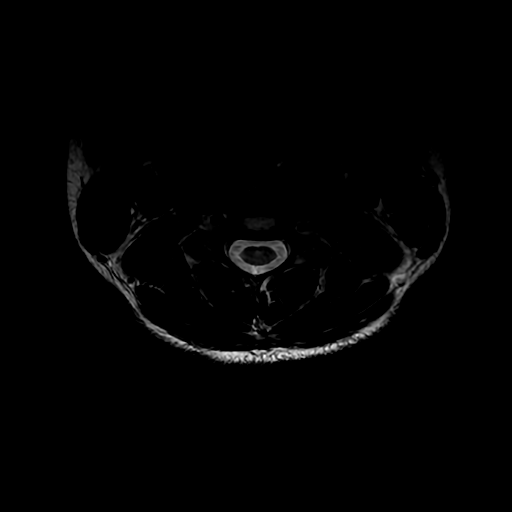
[im 26/33]
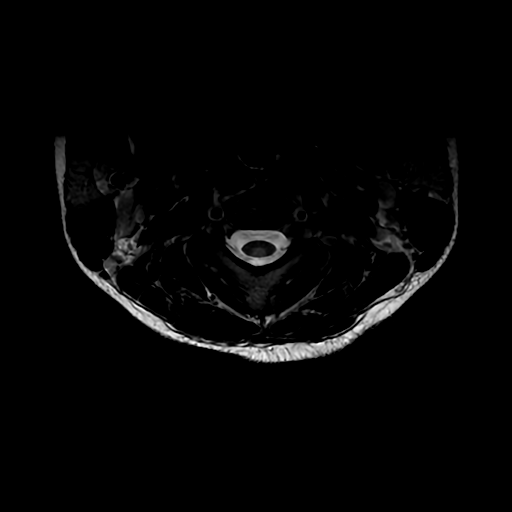
[im 33/33]
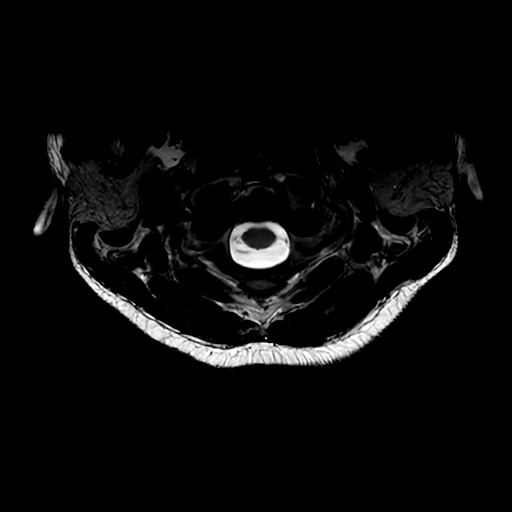

[Series 7: T1 · axial · non-contrast · 3.0mm · 0.35mm/px · z∈[-133,-31]mm · 6 of 33 slices shown]
[im 1/33]
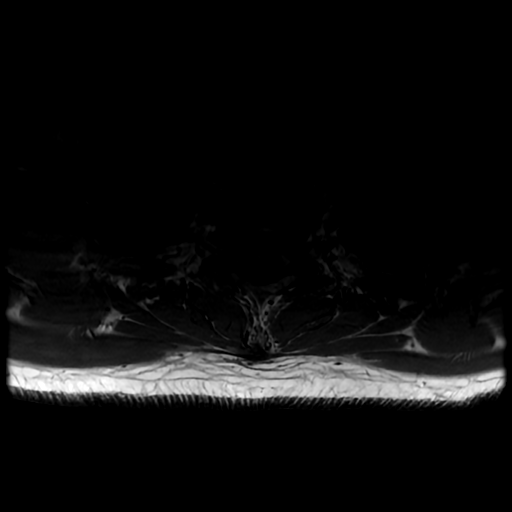
[im 7/33]
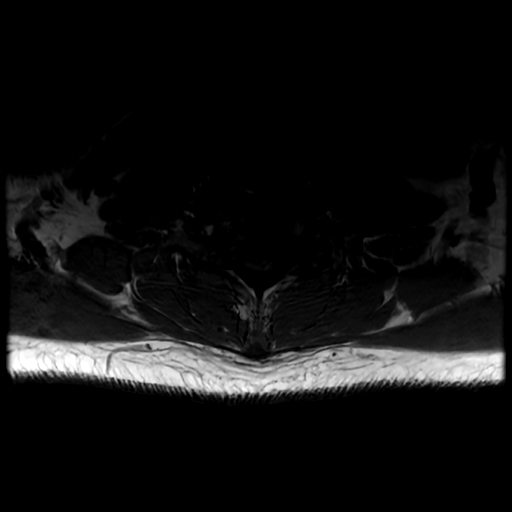
[im 13/33]
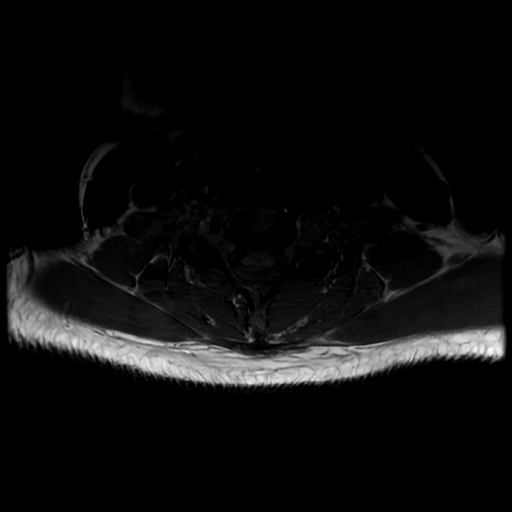
[im 20/33]
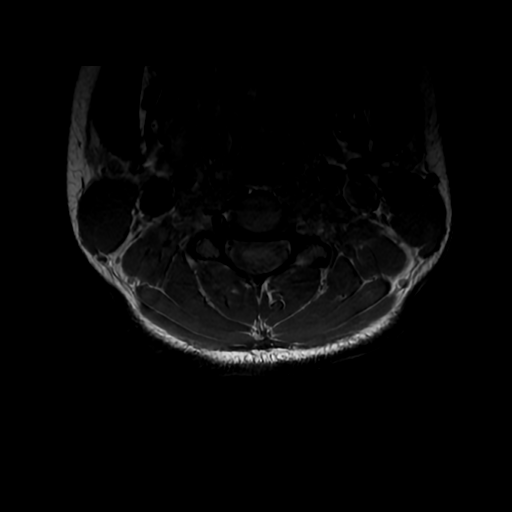
[im 26/33]
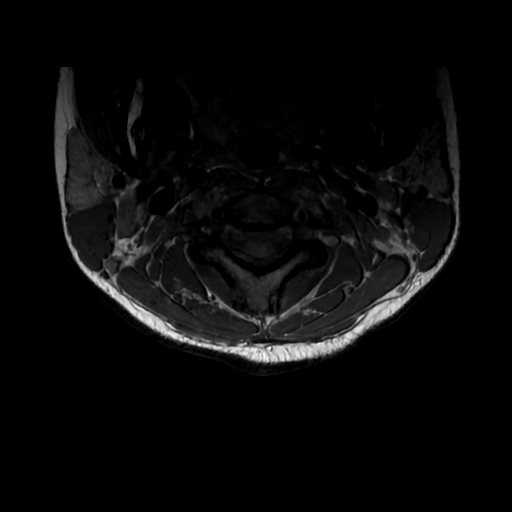
[im 33/33]
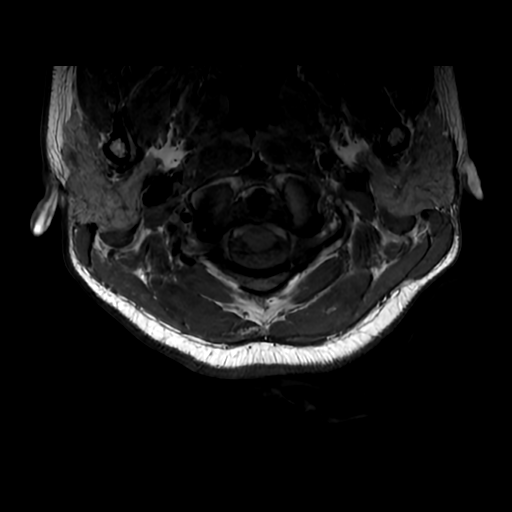

[Series 18: T1 fat-sat post-contrast · sagittal · 3.0mm · 0.43mm/px · 3 of 15 slices shown]
[im 1/15]
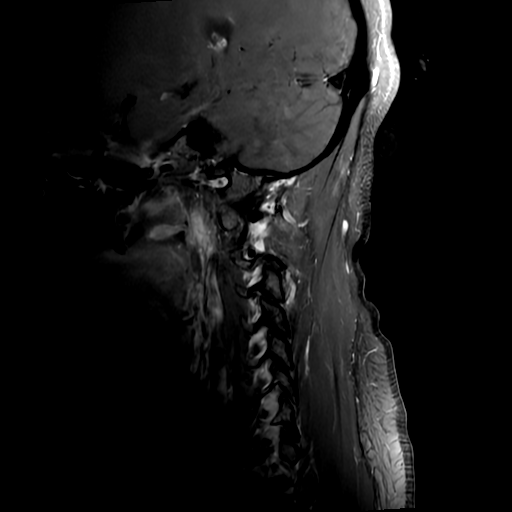
[im 8/15]
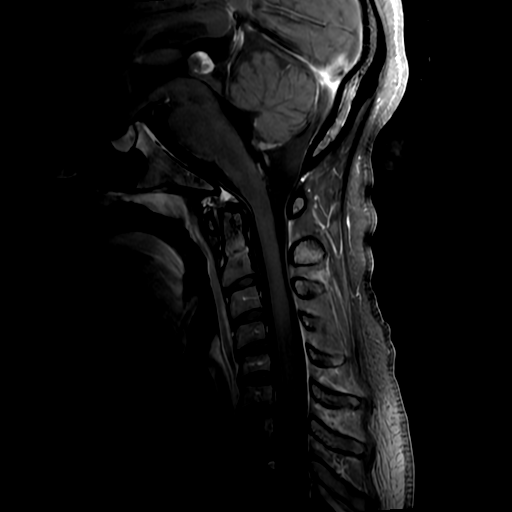
[im 15/15]
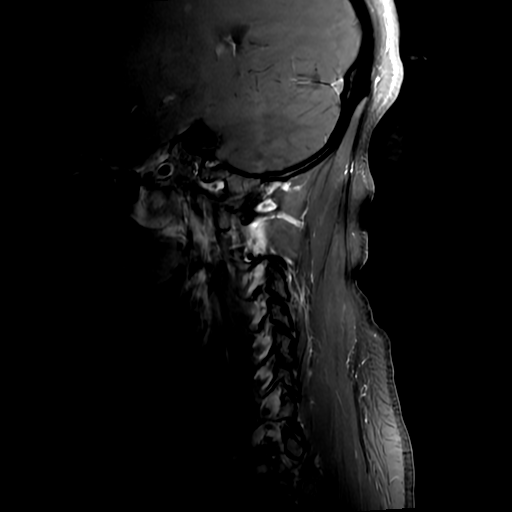

[19 of 48 positions shown; findings below may reference images not displayed]

FINDINGS: Alignment: Physiologic.

Vertebrae: No fracture, evidence of discitis, or bone lesion.

Cord: Extensive long segment abnormal T2/STIR signal throughout the
entirety of the cervical cord extending from the C1 level to the
C7-T1 disc space (series 4, images 8-9). No associated enhancement
on postcontrast sequences. No cord expansion.

Posterior Fossa, vertebral arteries, paraspinal tissues: White
matter lesions within the brain, as described on recent MRI. Cystic
changes within the pineal gland, also recently described. Vertebral
artery flow voids are preserved. No paraspinal mass or abnormality.

Disc levels:

Negative. Intervertebral discs are preserved without disc height
loss or focal disc protrusion. Normal facet joints. No foraminal or
canal stenosis of any level.
IMPRESSION: Extensive long segment abnormal T2/STIR signal throughout the
entirety of the cervical cord extending from the C1 level to the
C7-T1 disc space. Findings are most consistent with demyelinating
disease. No associated enhancement on postcontrast sequence to
suggest active demyelination at this time.

## 2022-04-13 MED ORDER — GADOBUTROL 1 MMOL/ML IV SOLN
7.6000 mL | Freq: Once | INTRAVENOUS | Status: AC | PRN
  Administered 2022-04-13: 7.6 mL via INTRAVENOUS

## 2022-04-13 MED ORDER — CEPHALEXIN 500 MG PO CAPS: 500.0000 mg | ORAL_CAPSULE | Freq: Three times a day (TID) | ORAL | Status: DC

## 2022-04-13 NOTE — Progress Notes (Signed)
Subjective: Feels less fatigue and numbness in right leg has improved. States gait is improving. No other complaints.  Objective: Current vital signs: BP 118/61 (BP Location: Right Arm)   Pulse 90   Temp 97.6 F (36.4 C) (Oral)   Resp 18   Ht 5\' 6"  (1.676 m)   Wt 76 kg   SpO2 98%   BMI 27.04 kg/m  Vital signs in last 24 hours: Temp:  [97.6 F (36.4 C)-98.3 F (36.8 C)] 97.6 F (36.4 C) (05/20 0818) Pulse Rate:  [66-90] 90 (05/20 0818) Resp:  [16-18] 18 (05/20 0818) BP: (104-119)/(53-69) 118/61 (05/20 0818) SpO2:  [98 %-100 %] 98 % (05/20 0818)  Intake/Output from previous day: 05/19 0701 - 05/20 0700 In: 630 [P.O.:480; IV Piggyback:150] Out: -  Intake/Output this shift: No intake/output data recorded. Nutritional status:  Diet Order             Diet regular Room service appropriate? Yes; Fluid consistency: Thin  Diet effective now                   Neurologic Exam: Ment: Intact to complex questions and commands. She is on her  laptop taking a class quiz CN: Fixates and tracks normally. Face symmetric.  Motor: RUE and RLE 5/5 LUE 5/5 LLE 4/5 proximally and distally Sensory: Unchanged Gait: Unchanged antalgic gait and improved   Lab Results: Results for orders placed or performed during the hospital encounter of 04/10/22 (from the past 48 hour(s))  HIV Antibody (routine testing w rflx)     Status: None   Collection Time: 04/11/22  3:18 PM  Result Value Ref Range   HIV Screen 4th Generation wRfx Non Reactive Non Reactive    Comment: Performed at [facility], [address] [place], [place]  TSH     Status: None   Collection Time: 04/11/22  3:18 PM  Result Value Ref Range   TSH 0.937 0.350 - 4.500 uIU/mL    Comment: Performed by a 3rd Generation assay with a functional sensitivity of <=0.01 uIU/mL. Performed at [facility], [address], [place], [place]   CBC     Status: None   Collection Time: 04/12/22  3:03 AM   Result Value Ref Range   WBC 8.1 4.0 - 10.5 K/uL   RBC 4.68 3.87 - 5.11 MIL/uL   Hemoglobin 13.4 12.0 - 15.0 g/dL   HCT 04/14/22 38.2 - 50.5 %   MCV 85.7 80.0 - 100.0 fL   MCH 28.6 26.0 - 34.0 pg   MCHC 33.4 30.0 - 36.0 g/dL   RDW 39.7 67.3 - 41.9 %   Platelets 306 150 - 400 K/uL   nRBC 0.0 0.0 - 0.2 %    Comment: Performed at [facility], [address], [place], [place]  Basic metabolic panel     Status: Abnormal   Collection Time: 04/12/22  3:03 AM  Result Value Ref Range   Sodium 138 135 - 145 mmol/L   Potassium 5.0 3.5 - 5.1 mmol/L   Chloride 109 98 - 111 mmol/L   CO2 23 22 - 32 mmol/L   Glucose, Bld 125 (H) 70 - 99 mg/dL    Comment: Glucose reference range applies only to samples taken after fasting for at least 8 hours.   BUN 8 6 - 20 mg/dL   Creatinine, Ser 04/14/22 0.44 - 1.00 mg/dL   Calcium 9.6 8.9 - 7.35 mg/dL  GFR, Estimated >60 >60 mL/min    Comment: (NOTE) Calculated using the CKD-EPI Creatinine Equation (2021)    Anion gap 6 5 - 15    Comment: Performed at [facility], [address], [place], [place] [address]    Recent Results (from the past 240 hour(s))  Urine Culture     Status: Abnormal   Collection Time: 04/11/22  8:15 AM   Specimen: Urine, Clean Catch  Result Value Ref Range Status   Specimen Description URINE, CLEAN CATCH  Final   Special Requests   Final    NONE Performed at [facility], [address], [place], [place] [address]    Culture MULTIPLE SPECIES PRESENT, SUGGEST RECOLLECTION (A)  Final   Report Status 04/12/2022 FINAL  Final    Lipid Panel No results for input(s): CHOL, TRIG, HDL, CHOLHDL, VLDL, LDLCALC in the last 72 hours.  Studies/Results: No results found.  Medications: Scheduled:  enoxaparin (LOVENOX) injection  40 mg Subcutaneous Daily   pantoprazole  40 mg Oral Daily   Continuous:  methylPREDNISolone (SOLU-MEDROL) injection 1,000 mg (04/13/22 0017)   Assessment: 21 year old female  presenting with symptoms, signs and imaging findings most consistent with an acute MS exacerbation. She has a described history of waxing and waning neurological deficits previously, suggestive of possible onset of demyelinating disease approximately 2-3 years ago. She states that she has never had visual symptoms.  - Exam is somewhat improved after 2/5 steroid doses with mild LLE weakness in conjunction with spasticity and antalgic gait - MRI brain: Abnormal cerebral white matter signal, and evidence of abnormal cervical spinal cord signal at the C2 level, having a configuration most suggestive of demyelinating disease. There is possible subacute demyelination in the right hemisphere, but no enhancing lesions. - MRI lumbar spine: Negative MRI appearance of the lumbar spine, but with evidence of multifocal lower thoracic spinal cord lesions suspicious for chronic demyelination in light of brain MRI findings today - MRI of cervical and thoracic spine with and without contrast is scheduled for today - Daily Marijuana use   Recommendations: - Neuro checks q6 hours - Today is day 3/5 of IV Solumedrol (1 gram IV Q day) - Monitor CBG, daily CBC and daily BMP while on steroids.  - Protonix 40 mg po q day - MRI Cervical and Thoracic Spine w/wo contrast - pending - PT/OT consultation to assess mobility - Bladder Scan Q shift - Endorses daily marijuana use. Use of this substance has been shown to be correlated with worse MS disability, per the literature. Recommend cessation of the use of marijuana. D/W patient and consider cessation - Outpatient follow up with [facility] has previously been scheduled   LOS: 2 days   Camillo Flaming , PA-C Neuro-Hospitalist APP  Electronically signed: Dr. Caryl Pina

## 2022-04-13 NOTE — Assessment & Plan Note (Addendum)
Reported urinary hesitancy prior to admission, UA showing leukocytes. Urine culture contaminated with multiple species.  Gotten 2 days Rocephin, no symptoms now.  I'm dubious of UTI.  She tells me today she never really felt like she had symptoms of one. - Stop antibiotics and monitor

## 2022-04-13 NOTE — Progress Notes (Signed)
  Progress Note   Patient: Robin Mercer [id number] DOB: October 23, 2001 DOA: 04/10/2022     2 DOS: the patient was seen and examined on 04/13/2022 at 11:25AM      Brief hospital course: [name] is a 21 y.o. F with no significant PMHx who presented with right leg numbness in the setting of pre-existing right foot drop.  In the ER, MRI brain and spinal cord showed enhancing lesions suggestive of MS.     Assessment and Plan: * Multiple sclerosis (HCC) - Continue Solu-medrol 1g daily, day 3 of 5 - CBC, CBG, BMP and bladder scans while on high dose steroids - PPI  Pyuria Reported urinary hesitancy prior to admission, UA showing leukocytes. Urine culture contaminated with multiple species.  Gotten 2 days Rocephin, no symptoms now.  I'm dubious of UTI.  She tells me today she never really felt like she had symptoms of one. - Stop antibiotics and monitor          Subjective: Numbness slightly better.  No abdominal pain, urinary irritative symptoms.     Physical Exam: Vitals:   04/12/22 2036 04/12/22 2358 04/13/22 0451 04/13/22 0818  BP: 119/67 104/64 105/68 118/61  Pulse: 75 68 66 90  Resp: 18 18 16 18   Temp: 97.7 F (36.5 C) 98 F (36.7 C) 98.1 F (36.7 C) 97.6 F (36.4 C)  TempSrc: Oral Oral Oral Oral  SpO2: 98% 99% 100% 98%  Weight:      Height:       Adult female, lying in bed, no acute distress, interactive, working on her laptop. RRR no mumurs, no LE edema Respiratory rate normal, lungs clear no rales or wheezes. Abdomen soft, no tenderness Attention normal, speech fluent, moves upper extremities without weakness or discoordination  Data Reviewed: Discussed with Neurology, nursing notes reviewed, CBC, BMP and glucoses normal       Disposition: Status is: Inpatient         Author: Edwin Dada, MD 04/13/2022 11:32 AM  For on call review [email or web address].

## 2022-04-14 DIAGNOSIS — G35 Multiple sclerosis: Secondary | ICD-10-CM | POA: Diagnosis not present

## 2022-04-14 NOTE — Progress Notes (Signed)
Subjective: Feels less fatigue. Numbness in her leg has improved. States gait is improving. No other complaints.  Objective: Current vital signs: BP 111/70 (BP Location: Left Arm)   Pulse 61   Temp 98.1 F (36.7 C) (Oral)   Resp 16   Ht 5\' 6"  (1.676 m)   Wt 76 kg   SpO2 93%   BMI 27.04 kg/m  Vital signs in last 24 hours: Temp:  [97.5 F (36.4 C)-98.1 F (36.7 C)] 98.1 F (36.7 C) (05/21 1205) Pulse Rate:  [51-64] 61 (05/21 1205) Resp:  [16-18] 16 (05/21 1205) BP: (102-116)/(68-89) 111/70 (05/21 1205) SpO2:  [93 %-99 %] 93 % (05/21 1205)  Intake/Output from previous day: No intake/output data recorded. Intake/Output this shift: No intake/output data recorded. Nutritional status:  Diet Order             Diet regular Room service appropriate? Yes; Fluid consistency: Thin  Diet effective now                   Neurologic Exam: Ment: Intact to complex questions and commands. She is on her  laptop taking a class quiz CN: Fixates and tracks normally. Face symmetric.  Motor: RUE and RLE 5/5 LUE 5/5 LLE 4/5 proximally and distally Sensory: Unchanged Gait: Unchanged antalgic gait and improved   Lab Results: No results found for this or any previous visit (from the past 48 hour(s)).   Recent Results (from the past 240 hour(s))  Urine Culture     Status: Abnormal   Collection Time: 04/11/22  8:15 AM   Specimen: Urine, Clean Catch  Result Value Ref Range Status   Specimen Description URINE, CLEAN CATCH  Final   Special Requests   Final    NONE Performed at [facility], [address], [place], [place]    Culture MULTIPLE SPECIES PRESENT, SUGGEST RECOLLECTION (A)  Final   Report Status 04/12/2022 FINAL  Final    Lipid Panel No results for input(s): CHOL, TRIG, HDL, CHOLHDL, VLDL, LDLCALC in the last 72 hours.  Studies/Results: MR CERVICAL SPINE W WO CONTRAST  Result Date: 04/13/2022 CLINICAL DATA:  Myelopathy, acute, cervical spine EXAM:  MRI CERVICAL SPINE WITHOUT AND WITH CONTRAST TECHNIQUE: Multiplanar and multiecho pulse sequences of the cervical spine, to include the craniocervical junction and cervicothoracic junction, were obtained without and with intravenous contrast. CONTRAST:  7.76mL GADAVIST GADOBUTROL 1 MMOL/ML IV SOLN COMPARISON:  MRI head 04/11/2022 FINDINGS: Alignment: Physiologic. Vertebrae: No fracture, evidence of discitis, or bone lesion. Cord: Extensive long segment abnormal T2/STIR signal throughout the entirety of the cervical cord extending from the C1 level to the C7-T1 disc space (series 4, images 8-9). No associated enhancement on postcontrast sequences. No cord expansion. Posterior Fossa, vertebral arteries, paraspinal tissues: White matter lesions within the brain, as described on recent MRI. Cystic changes within the pineal gland, also recently described. Vertebral artery flow voids are preserved. No paraspinal mass or abnormality. Disc levels: Negative. Intervertebral discs are preserved without disc height loss or focal disc protrusion. Normal facet joints. No foraminal or canal stenosis of any level. IMPRESSION: Extensive long segment abnormal T2/STIR signal throughout the entirety of the cervical cord extending from the C1 level to the C7-T1 disc space. Findings are most consistent with demyelinating disease. No associated enhancement on postcontrast sequence to suggest active demyelination at this time. Electronically Signed   By: 04/13/2022 D.O.   On: 04/13/2022 18:13   MR THORACIC SPINE W WO CONTRAST  Result Date: 04/13/2022 CLINICAL DATA:  Myelopathy, acute, thoracic spine EXAM: MRI THORACIC WITHOUT AND WITH CONTRAST TECHNIQUE: Multiplanar and multiecho pulse sequences of the thoracic spine were obtained without and with intravenous contrast. CONTRAST:  7.4mL GADAVIST GADOBUTROL 1 MMOL/ML IV SOLN COMPARISON:  None Available. FINDINGS: Alignment:  Physiologic. Vertebrae: No fracture, evidence of discitis,  or bone lesion. Cord: Multifocal T2/STIR hyperintense cord lesions throughout the thoracic spine, each measuring between 1.0 and 2.0 cm in length (series 11, image 8). Individual lesions are centered at the level of the T4 vertebral body, T7 vertebral body, T10 vertebral body, and T12 vertebral body. No enhancement within any of the lesions on postcontrast sequences. No cord expansion. Paraspinal and other soft tissues: Unremarkable. Disc levels: Negative. Intervertebral discs of the thoracic spine are maintained. No focal disc protrusion. Unremarkable facet joints. No foraminal or canal stenosis at any level. IMPRESSION: Multifocal T2/STIR hyperintense cord lesions throughout the thoracic spine. Findings are most consistent with demyelinating disease. No associated enhancement on postcontrast sequences to suggest active demyelination at this time. Electronically Signed   By: Duanne Guess D.O.   On: 04/13/2022 18:20    Medications: Scheduled:  enoxaparin (LOVENOX) injection  40 mg Subcutaneous Daily   pantoprazole  40 mg Oral Daily   Continuous:  methylPREDNISolone (SOLU-MEDROL) injection 1,000 mg (04/14/22 1610)   Assessment: 21 year old female presenting with symptoms, signs and imaging findings most consistent with an acute MS exacerbation. She has a described history of waxing and waning neurological deficits previously, suggestive of possible onset of demyelinating disease approximately 2-3 years ago. She states that she has never had visual symptoms.  - Exam is somewhat improved after 2/5 steroid doses with mild LLE weakness in conjunction with spasticity and antalgic gait - MRI brain: Abnormal cerebral white matter signal, and evidence of abnormal cervical spinal cord signal at the C2 level, having a configuration most suggestive of demyelinating disease. There is possible subacute demyelination in the right hemisphere, but no enhancing lesions. - MRI lumbar spine: Negative MRI appearance of  the lumbar spine, but with evidence of multifocal lower thoracic spinal cord lesions suspicious for chronic demyelination in light of brain MRI findings today - MRI of cervical spine with and without contrast:  Extensive long segment abnormal T2/STIR signal throughout the entirety of the cervical cord extending from the C1 level to the C7-T1 disc space. Findings are most consistent with demyelinating disease. No associated enhancement on postcontrast sequence to suggest active demyelination at this time. - MRI thoracic spine: Multifocal T2/STIR hyperintense cord lesions throughout the thoracic spine. Findings are most consistent with demyelinating disease. No associated enhancement on postcontrast sequences to suggest active demyelination at this time. - Daily Marijuana use   Recommendations: - Neuro checks q6 hours - Today is day 4/5 of IV Solumedrol (1 gram IV Q day) - Monitor CBG, daily CBC and daily BMP while on steroids.  - Protonix 40 mg po q day - PT/OT consultation to assess mobility - Bladder Scan Q shift - Endorses daily marijuana use. Use of this substance has been shown to be correlated with worse MS disability, per the literature. Recommend cessation of the use of marijuana. D/W patient and consider cessation -Patient neurological examination improving , Expect she will be discharged home after 5th and last steroid infusion tomorrow - Outpatient follow up with [facility] has previously been scheduled   LOS: 3 days    Camillo Flaming , PA-C Neuro-Hospitalist APP  Electronically signed: Dr.  Caryl Pina

## 2022-04-14 NOTE — Progress Notes (Signed)
  Progress Note   Patient: Robin Mercer [id number] DOB: [birth year] DOA: 04/10/2022     3 DOS: the patient was seen and examined on 04/14/2022         Brief hospital course: Robin Mercer is a 21 y.o. F with no significant PMHx who presented with right leg numbness in the setting of pre-existing right foot drop.  In the ER, MRI brain and spinal cord showed enhancing lesions suggestive of Robin.     Assessment and Plan: * Multiple sclerosis (HCC) -Continue Solu-Medrol, day 4 of 5 - Continue PPI           Subjective: No new complaints, leg numbness is Mercer slightly better     Physical Exam: Vitals:   04/14/22 0436 04/14/22 0815 04/14/22 1205 04/14/22 1559  BP: 106/68 116/68 111/70 114/72  Pulse: (!) 51 (!) 54 61 (!) 56  Resp: 17 16 16 16   Temp: 97.9 F (36.6 C) 98 F (36.7 C) 98.1 F (36.7 C) 98.1 F (36.7 C)  TempSrc: Oral Oral Oral Oral  SpO2: 98% 99% 93% 99%  Weight:      Height:       Adult female, sitting up in bed, interactive, appropriate RRR, no murmurs, no peripheral edema Respiratory rate normal, lungs clear without rales or wheezes Attention normal, affect appropriate, judgment insight appear normal     Disposition: Status is: Inpatient         Author: Edwin Dada, MD 04/14/2022 4:54 PM  For on call review [email or web address]

## 2022-04-15 DIAGNOSIS — G35 Multiple sclerosis: Secondary | ICD-10-CM | POA: Diagnosis not present

## 2022-04-15 DIAGNOSIS — R8281 Pyuria: Secondary | ICD-10-CM | POA: Diagnosis not present

## 2022-04-15 LAB — BASIC METABOLIC PANEL
Anion gap: 5 (ref 5–15)
BUN: 11 mg/dL (ref 6–20)
CO2: 25 mmol/L (ref 22–32)
Calcium: 8.8 mg/dL — ABNORMAL LOW (ref 8.9–10.3)
Chloride: 108 mmol/L (ref 98–111)
Creatinine, Ser: 0.85 mg/dL (ref 0.44–1.00)
GFR, Estimated: >60 mL/min (ref >60)
Glucose, Bld: 126 mg/dL — ABNORMAL HIGH (ref 70–99)
Potassium: 4.5 mmol/L (ref 3.5–5.1)
Sodium: 138 mmol/L (ref 135–145)

## 2022-04-15 LAB — GLUCOSE, CAPILLARY
Glucose-Capillary: 113 mg/dL — ABNORMAL HIGH (ref 70–99)
Glucose-Capillary: 109 mg/dL — ABNORMAL HIGH (ref 70–99)

## 2022-04-15 LAB — CBC
HCT: 37.4 % (ref 36.0–46.0)
Hemoglobin: 12.2 g/dL (ref 12.0–15.0)
MCH: 28.5 pg (ref 26.0–34.0)
MCHC: 32.6 g/dL (ref 30.0–36.0)
MCV: 87.4 fL (ref 80.0–100.0)
Platelets: 273 10*3/uL (ref 150–400)
RBC: 4.28 MIL/uL (ref 3.87–5.11)
RDW: 13.9 % (ref 11.5–15.5)
WBC: 10.5 10*3/uL (ref 4.0–10.5)
nRBC: 0 % (ref 0.0–0.2)

## 2022-04-15 NOTE — Progress Notes (Signed)
Subjective: Feels less fatigue. Numbness in her leg has improved. States gait is improving. No other complaints. Add on JC Virus PCR to morning labs. Follow up scheduled with Dr. Felecia Shelling on 5/31.   Objective: Current vital signs: BP (!) 110/55 (BP Location: Right Arm)   Pulse 64   Temp 97.7 F (36.5 C) (Oral)   Resp 17   Ht 5\' 6"  (1.676 m)   Wt 76 kg   SpO2 100%   BMI 27.04 kg/m  Vital signs in last 24 hours: Temp:  [97.7 F (36.5 C)-98.7 F (37.1 C)] 97.7 F (36.5 C) (05/22 0755) Pulse Rate:  [55-64] 64 (05/22 0432) Resp:  [14-17] 17 (05/22 0755) BP: (97-114)/(55-84) 110/55 (05/22 0755) SpO2:  [98 %-100 %] 100 % (05/22 0432)  Intake/Output from previous day: No intake/output data recorded. Intake/Output this shift: No intake/output data recorded. Nutritional status:  Diet Order             Diet regular Room service appropriate? Yes; Fluid consistency: Thin  Diet effective now                   Neurologic Exam: Ment: Intact to complex questions and commands. Getting her belongings ready for discharge CN: Fixates and tracks normally. Face symmetric.  Motor: RUE and RLE 5/5 LUE 5/5 LLE 4/5 proximally and distally Sensory: intact  Lab Results: Results for orders placed or performed during the hospital encounter of 04/10/22 (from the past 48 hour(s))  CBC     Status: None   Collection Time: 04/15/22  1:43 AM  Result Value Ref Range   WBC 10.5 4.0 - 10.5 K/uL   RBC 4.28 3.87 - 5.11 MIL/uL   Hemoglobin 12.2 12.0 - 15.0 g/dL   HCT 37.4 36.0 - 46.0 %   MCV 87.4 80.0 - 100.0 fL   MCH 28.5 26.0 - 34.0 pg   MCHC 32.6 30.0 - 36.0 g/dL   RDW 13.9 11.5 - 15.5 %   Platelets 273 150 - 400 K/uL   nRBC 0.0 0.0 - 0.2 %    Comment: Performed at [facility], [place] [address] [place],  [id number]  Basic metabolic panel     Status: Abnormal   Collection Time: 04/15/22  1:43 AM  Result Value Ref Range   Sodium 138 135 - 145 mmol/L   Potassium 4.5 3.5 - 5.1  mmol/L   Chloride 108 98 - 111 mmol/L   CO2 25 22 - 32 mmol/L   Glucose, Bld 126 (H) 70 - 99 mg/dL    Comment: Glucose reference range applies only to samples taken after fasting for at least 8 hours.   BUN 11 6 - 20 mg/dL   Creatinine, Ser 0.85 0.44 - 1.00 mg/dL   Calcium 8.8 (L) 8.9 - 10.3 mg/dL   GFR, Estimated >60 >60 mL/min    Comment: (NOTE) Calculated using the CKD-EPI Creatinine Equation (2021)    Anion gap 5 5 - 15    Comment: Performed at [facility] [address] [place], [place] [address]  Glucose, capillary     Status: Abnormal   Collection Time: 04/15/22  4:41 AM  Result Value Ref Range   Glucose-Capillary 113 (H) 70 - 99 mg/dL    Comment: Glucose reference range applies only to samples taken after fasting for at least 8 hours.  Glucose, capillary     Status: Abnormal   Collection Time: 04/15/22  7:57 AM  Result Value Ref Range  Glucose-Capillary 109 (H) 70 - 99 mg/dL    Comment: Glucose reference range applies only to samples taken after fasting for at least 8 hours.     Recent Results (from the past 240 hour(s))  Urine Culture     Status: Abnormal   Collection Time: 04/11/22  8:15 AM   Specimen: Urine, Clean Catch  Result Value Ref Range Status   Specimen Description URINE, CLEAN CATCH  Final   Special Requests   Final    NONE Performed at [facility], [address], [place], [place] [address]    Culture MULTIPLE SPECIES PRESENT, SUGGEST RECOLLECTION (A)  Final   Report Status 04/12/2022 FINAL  Final    Lipid Panel No results for input(s): CHOL, TRIG, HDL, CHOLHDL, VLDL, LDLCALC in the last 72 hours.  Studies/Results: MR CERVICAL SPINE W WO CONTRAST  Result Date: 04/13/2022 CLINICAL DATA:  Myelopathy, acute, cervical spine EXAM: MRI CERVICAL SPINE WITHOUT AND WITH CONTRAST TECHNIQUE: Multiplanar and multiecho pulse sequences of the cervical spine, to include the craniocervical junction and cervicothoracic junction, were obtained  without and with intravenous contrast. CONTRAST:  7.15mL GADAVIST GADOBUTROL 1 MMOL/ML IV SOLN COMPARISON:  MRI head 04/11/2022 FINDINGS: Alignment: Physiologic. Vertebrae: No fracture, evidence of discitis, or bone lesion. Cord: Extensive long segment abnormal T2/STIR signal throughout the entirety of the cervical cord extending from the C1 level to the C7-T1 disc space (series 4, images 8-9). No associated enhancement on postcontrast sequences. No cord expansion. Posterior Fossa, vertebral arteries, paraspinal tissues: White matter lesions within the brain, as described on recent MRI. Cystic changes within the pineal gland, also recently described. Vertebral artery flow voids are preserved. No paraspinal mass or abnormality. Disc levels: Negative. Intervertebral discs are preserved without disc height loss or focal disc protrusion. Normal facet joints. No foraminal or canal stenosis of any level. IMPRESSION: Extensive long segment abnormal T2/STIR signal throughout the entirety of the cervical cord extending from the C1 level to the C7-T1 disc space. Findings are most consistent with demyelinating disease. No associated enhancement on postcontrast sequence to suggest active demyelination at this time. Electronically Signed   By: Davina Poke D.O.   On: 04/13/2022 18:13   MR THORACIC SPINE W WO CONTRAST  Result Date: 04/13/2022 CLINICAL DATA:  Myelopathy, acute, thoracic spine EXAM: MRI THORACIC WITHOUT AND WITH CONTRAST TECHNIQUE: Multiplanar and multiecho pulse sequences of the thoracic spine were obtained without and with intravenous contrast. CONTRAST:  7.71mL GADAVIST GADOBUTROL 1 MMOL/ML IV SOLN COMPARISON:  None Available. FINDINGS: Alignment:  Physiologic. Vertebrae: No fracture, evidence of discitis, or bone lesion. Cord: Multifocal T2/STIR hyperintense cord lesions throughout the thoracic spine, each measuring between 1.0 and 2.0 cm in length (series 11, image 8). Individual lesions are centered at  the level of the T4 vertebral body, T7 vertebral body, T10 vertebral body, and T12 vertebral body. No enhancement within any of the lesions on postcontrast sequences. No cord expansion. Paraspinal and other soft tissues: Unremarkable. Disc levels: Negative. Intervertebral discs of the thoracic spine are maintained. No focal disc protrusion. Unremarkable facet joints. No foraminal or canal stenosis at any level. IMPRESSION: Multifocal T2/STIR hyperintense cord lesions throughout the thoracic spine. Findings are most consistent with demyelinating disease. No associated enhancement on postcontrast sequences to suggest active demyelination at this time. Electronically Signed   By: Davina Poke D.O.   On: 04/13/2022 18:20    Medications: Scheduled:  enoxaparin (LOVENOX) injection  40 mg Subcutaneous Daily   pantoprazole  40 mg  Oral Daily   Continuous:   Assessment: 21 year old female presenting with symptoms, signs and imaging findings most consistent with an acute MS exacerbation. She has a described history of waxing and waning neurological deficits previously, suggestive of possible onset of demyelinating disease approximately 2-3 years ago. She states that she has never had visual symptoms.  - Exam is somewhat improved after 5/5 steroid doses with mild LLE weakness in conjunction with spasticity and antalgic gait - MRI brain: Abnormal cerebral white matter signal, and evidence of abnormal cervical spinal cord signal at the C2 level, having a configuration most suggestive of demyelinating disease. There is possible subacute demyelination in the right hemisphere, but no enhancing lesions. - MRI lumbar spine: Negative MRI appearance of the lumbar spine, but with evidence of multifocal lower thoracic spinal cord lesions suspicious for chronic demyelination in light of brain MRI findings today - MRI of cervical spine with and without contrast:  Extensive long segment abnormal T2/STIR signal throughout  the entirety of the cervical cord extending from the C1 level to the C7-T1 disc space. Findings are most consistent with demyelinating disease. No associated enhancement on postcontrast sequence to suggest active demyelination at this time. - MRI thoracic spine: Multifocal T2/STIR hyperintense cord lesions throughout the thoracic spine. Findings are most consistent with demyelinating disease. No associated enhancement on postcontrast sequences to suggest active demyelination at this time. - Daily Marijuana use   Recommendations: - Neuro checks q6 hours - Today is day 5/5 of IV Solumedrol (1 gram IV Q day) - Monitor CBG, daily CBC and daily BMP while on steroids.  - Protonix 40 mg po q day - PT/OT consultation to assess mobility - Bladder Scan Q shift - Endorses daily marijuana use. Use of this substance has been shown to be correlated with worse MS disability, per the literature. Recommend cessation of the use of marijuana. D/W patient and consider cessation - Outpatient follow up with [facility] has previously been scheduled and plan for discharge today    LOS: 4 days    Patient seen and examined by NP/APP with MD. MD to update note as needed.   Janine Ores, DNP, FNP-BC [facility] Pager: [phone]

## 2022-04-15 NOTE — Discharge Summary (Signed)
Physician Discharge Summary   Patient: Robin Mercer MRN: [id number] DOB: Jul 28, 2001  Admit date:     04/10/2022  Discharge date: 04/15/22  Discharge Physician: Robin Mercer   PCP: [facility]     Recommendations at discharge:  Follow up with [facility] in 10 days     Discharge Diagnoses: Principal Problem:   Multiple sclerosis [facility]) Active Problems:   Asymptomatic pyuria        Hospital Course: Robin Mercer is a 21 y.o. F with no significant PMHx who presented with right leg numbness in the setting of pre-existing right foot drop.  In the ER, MRI brain and spinal cord showed lesions suggestive of Robin.     * Likely multiple sclerosis  Admitted and treated with Solu-Medrol 1g daily for 5 days.  BP and glucose and CBC remained normal.  Tolerated treatment well, numbness in leg improved somewhat.  Discahrged with follow up at [facility] on May 31st.     Pyuria Reported urinary hesitancy prior to admission, UA showed leukocytes. Urine culture contaminated with multiple species.  Treated with 2 days Rocephin, had no further symptoms.  Doubt UTI, patient felt she never had symptoms of one.  Observed off antibiotics for 3 days, no fever, no symptoms.                The [facility] Controlled Substances Registry was reviewed for this patient prior to discharge.  Consultants: Neurology Procedures performed: MRI brain, MRI C/T/L spine with and without contrast  Disposition: Home   DISCHARGE MEDICATION: Allergies as of 04/15/2022   No Known Allergies      Medication List     TAKE these medications    MAGNESIUM PO Take 1 tablet by mouth daily.        Follow-up Information     [facility] Follow up.   Specialty: Neurology Contact information: [address] [place] [address] [phone]                Discharge Instructions     Discharge instructions    Complete by: As directed    From Dr. Maryfrances Mercer: You were admitted for numbness in the leg and foot drop. Your MRI brain and spine showed some white matter changes and imaging findings of "demyelination" in the brain and spinal cord consistent with multiple sclerosis  You were treated with high dose steroids here.  You should follow up with [facility].  I called them and they rescheduled you for [place] May 31 at 9AM with Dr. Terrace Mercer (see below in the To Do section)   Increase activity slowly   Complete by: As directed        Discharge Exam: Filed Weights   04/10/22 2109  Weight: 76 kg    General: Pt is alert, awake, not in acute distress Cardiovascular: RRR, nl S1-S2, no murmurs appreciated.   No LE edema.   Respiratory: Normal respiratory rate and rhythm.  CTAB without rales or wheezes. Abdominal: Abdomen soft and non-tender.  No distension or HSM.   Neuro/Psych: Strength symmetric in upper and lower extremities.  Judgment and insight appear normal.   Condition at discharge: Good  The results of significant diagnostics from this hospitalization (including imaging, microbiology, ancillary and laboratory) are listed below for reference.   Imaging Studies: MR Brain W and Wo Contrast  Result Date: 04/11/2022 CLINICAL DATA:  21 year old female with intermittent numbness and weakness. Impaired coordination of the right leg. EXAM:  MRI HEAD WITHOUT AND WITH CONTRAST TECHNIQUE: Multiplanar, multiecho pulse sequences of the brain and surrounding structures were obtained without and with intravenous contrast. CONTRAST:  6mL GADAVIST GADOBUTROL 1 MMOL/ML IV SOLN COMPARISON:  None Available. FINDINGS: Brain: There is abnormal T2 hyperintensity and volume loss in the C1-C2 spinal cord visible on the 1st axial T2 image series 10, image 1. No evidence of associated enhancement there. Round and nodular abnormal bilateral cerebral white matter T2 and FLAIR hyperintensity, most pronounced in the  right centrum semi of bowel (series 11, image 21) with abnormal diffusion although mostly T2 shine through (series 5, image 96). No associated enhancement. A right periatrial white matter lesion also has mildly abnormal diffusion but no enhancement. Temporal lobes appear spared. Deep gray nuclei, brainstem and cerebellum appear spared. Subcortical white matter involvement but no cortical involvement or encephalomalacia. No chronic cerebral blood products. No abnormal enhancement identified. No restricted diffusion suggestive of acute infarction. No midline shift, mass effect, evidence of mass lesion, ventriculomegaly, extra-axial collection or acute intracranial hemorrhage. Cervicomedullary junction and pituitary are within normal limits. No dural thickening. Cystic change to the pineal gland (series 11, image 14 and series 9, image 14) with no suspicious enhancement or regional mass effect. Vascular: Major intracranial vascular flow voids are preserved. The major dural venous sinuses are enhancing and appear to be patent. Skull and upper cervical spine: Visualized bone marrow signal is within normal limits. Abnormal C2 cervical spinal cord T2 hyperintensity as above, but grossly normal spinal cord appearance on sagittal T1 imaging. Sinuses/Orbits: Grossly symmetric and normal orbits. Paranasal sinuses and mastoids are stable and well aerated. Other: Visible internal auditory structures appear normal. Negative visible scalp and face. IMPRESSION: 1. Abnormal cerebral white matter signal, and evidence of abnormal Cervical Spinal Cord signal at the C2 level, has a configuration most suggestive of demyelinating disease. Possible subacute demyelination in the right hemisphere, but no enhancing lesions. 2. No other acute intracranial abnormality. Electronically Signed   By: Robin Mercer M.D.   On: 04/11/2022 06:49   MR CERVICAL SPINE W WO CONTRAST  Result Date: 04/13/2022 CLINICAL DATA:  Myelopathy, acute, cervical spine  EXAM: MRI CERVICAL SPINE WITHOUT AND WITH CONTRAST TECHNIQUE: Multiplanar and multiecho pulse sequences of the cervical spine, to include the craniocervical junction and cervicothoracic junction, were obtained without and with intravenous contrast. CONTRAST:  7.66mL GADAVIST GADOBUTROL 1 MMOL/ML IV SOLN COMPARISON:  MRI head 04/11/2022 FINDINGS: Alignment: Physiologic. Vertebrae: No fracture, evidence of discitis, or bone lesion. Cord: Extensive long segment abnormal T2/STIR signal throughout the entirety of the cervical cord extending from the C1 level to the C7-T1 disc Mercer (series 4, images 8-9). No associated enhancement on postcontrast sequences. No cord expansion. Posterior Fossa, vertebral arteries, paraspinal tissues: White matter lesions within the brain, as described on recent MRI. Cystic changes within the pineal gland, also recently described. Vertebral artery flow voids are preserved. No paraspinal mass or abnormality. Disc levels: Negative. Intervertebral discs are preserved without disc height loss or focal disc protrusion. Normal facet joints. No foraminal or canal stenosis of any level. IMPRESSION: Extensive long segment abnormal T2/STIR signal throughout the entirety of the cervical cord extending from the C1 level to the C7-T1 disc Mercer. Findings are most consistent with demyelinating disease. No associated enhancement on postcontrast sequence to suggest active demyelination at this time. Electronically Signed   By: Duanne Guess D.O.   On: 04/13/2022 18:13   MR THORACIC SPINE W WO CONTRAST  Result Date: 04/13/2022 CLINICAL DATA:  Myelopathy, acute, thoracic spine EXAM: MRI THORACIC WITHOUT AND WITH CONTRAST TECHNIQUE: Multiplanar and multiecho pulse sequences of the thoracic spine were obtained without and with intravenous contrast. CONTRAST:  7.3mL GADAVIST GADOBUTROL 1 MMOL/ML IV SOLN COMPARISON:  None Available. FINDINGS: Alignment:  Physiologic. Vertebrae: No fracture, evidence of  discitis, or bone lesion. Cord: Multifocal T2/STIR hyperintense cord lesions throughout the thoracic spine, each measuring between 1.0 and 2.0 cm in length (series 11, image 8). Individual lesions are centered at the level of the T4 vertebral body, T7 vertebral body, T10 vertebral body, and T12 vertebral body. No enhancement within any of the lesions on postcontrast sequences. No cord expansion. Paraspinal and other soft tissues: Unremarkable. Disc levels: Negative. Intervertebral discs of the thoracic spine are maintained. No focal disc protrusion. Unremarkable facet joints. No foraminal or canal stenosis at any level. IMPRESSION: Multifocal T2/STIR hyperintense cord lesions throughout the thoracic spine. Findings are most consistent with demyelinating disease. No associated enhancement on postcontrast sequences to suggest active demyelination at this time. Electronically Signed   By: Duanne Guess D.O.   On: 04/13/2022 18:20   MR Lumbar Spine W Wo Contrast  Result Date: 04/11/2022 CLINICAL DATA:  21 year old female with intermittent numbness and weakness. Impaired coordination of the right leg. EXAM: MRI LUMBAR SPINE WITHOUT AND WITH CONTRAST TECHNIQUE: Multiplanar and multiecho pulse sequences of the lumbar spine were obtained without and with intravenous contrast. CONTRAST:  58mL GADAVIST GADOBUTROL 1 MMOL/ML IV SOLN COMPARISON:  Brain MRI today reported separately. FINDINGS: Segmentation: Lumbar segmentation appears to be normal and will be designated as such for this report. Alignment: Normal lumbar lordosis. There is subtle levoconvex lumbar scoliosis. No spondylolisthesis. Vertebrae: No marrow edema or evidence of acute osseous abnormality. Visualized bone marrow signal is within normal limits. Intact visible sacrum. Conus medullaris and cauda equina: Conus extends to the T12-L1 Signal in the conus appears normal, but there is subtle STIR and axial T2 imaging evidence of abnormal lower thoracic spinal  cord lesions at T10 and T11/T12 (series 14, image 8 and series 16, image 7). No evidence of abnormal lower spinal cord or conus enhancement. Cauda equina nerve roots appear normal. No abnormal lumbar intradural enhancement. No dural thickening. Level. Conus and cauda equina appear normal. Paraspinal and other soft tissues: Negative. Disc levels: Essentially negative. Capacious spinal canal. Normal intervertebral disc signal and morphology. There is mild right side lower lumbar posterior element hypertrophy. IMPRESSION: Negative MRI appearance of the Lumbar Spine, but evidence of multifocal lower thoracic Spinal Cord lesions suspicious for chronic demyelinating in light of Brain MRI findings today. Electronically Signed   By: Robin Mercer M.D.   On: 04/11/2022 06:53    Microbiology: Results for orders placed or performed during the hospital encounter of 04/10/22  Urine Culture     Status: Abnormal   Collection Time: 04/11/22  8:15 AM   Specimen: Urine, Clean Catch  Result Value Ref Range Status   Specimen Description URINE, CLEAN CATCH  Final   Special Requests   Final    NONE Performed at [facility], [address] [place], [place] [address]    Culture MULTIPLE SPECIES PRESENT, SUGGEST RECOLLECTION (A)  Final   Report Status 04/12/2022 FINAL  Final    Labs: CBC: Recent Labs  Lab 04/10/22 2134 04/12/22 0303 04/15/22 0143  WBC 7.3 8.1 10.5  HGB 13.6 13.4 12.2  HCT 40.8 40.1 37.4  MCV 86.8 85.7 87.4  PLT 313 306 273   Basic Metabolic Panel:  Recent Labs  Lab 04/10/22 2134 04/12/22 0303 04/15/22 0143  NA 139 138 138  K 4.0 5.0 4.5  CL 110 109 108  CO2 GLUCOSE 91 125* 126*  BUN CREATININE 0.84 0.76 0.85  CALCIUM 9.4 9.6 8.8*   Liver Function Tests: Recent Labs  Lab 04/10/22 2134  AST 14*  ALT 15  ALKPHOS 46  BILITOT 1.0  PROT 7.3  ALBUMIN 3.8   CBG: Recent Labs  Lab 04/15/22 0441 04/15/22 0757  GLUCAP 113* 109*    Discharge time spent:  approximately 25 minutes spent on discharge counseling, evaluation of patient on day of discharge, and coordination of discharge planning with nursing, social work, pharmacy and case management  Signed: Alberteen Sam, MD [facility] 04/15/2022

## 2022-04-18 LAB — JC VIRUS DNA,PCR (WHOLE BLOOD): JC Virus DNA, PCR, Blood: NEGATIVE

## 2022-04-23 ENCOUNTER — Ambulatory Visit: Payer: No Typology Code available for payment source | Admitting: Neurology

## 2022-04-23 NOTE — Progress Notes (Unsigned)
[facility]  PATIENT: Robin Mercer DOB: [birth year]  REFERRING DOCTOR OR PCP: Mare Loan, MD SOURCE: Patient, notes from recent hospitalization, imaging and lab reports, multiple MRI images personally reviewed.  _________________________________   HISTORICAL  CHIEF COMPLAINT:  No chief complaint on file.   HISTORY OF PRESENT ILLNESS:  I had the pleasure of seeing your patient, Robin Mercer, at the [facility] at [facility] for neurologic consultation regarding her recent neurologic symptoms and abnormal imaging studies worrisome for multiple sclerosis.  She is an 21 year old woman who   Imaging reviewed: MRI of the brain 04/11/2022 showed multiple T2/FLAIR hyperintense foci in the periventricular, juxtacortical and deep white matter.  None of the foci enhanced after contrast.  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  MRI of the lumbar spine 04/11/2022: There are T2 hyperintense foci adjacent to T10 and adjacent to T11-T12 (posterior).  They do not enhance.  MRI of the cervical and thoracic spine 04/13/2022 shows a longitudinally extensive T2 hyperintense focus from C1-C2 caudally to C7.  Additionally, there are T2 hyperintense foci within the thoracic spinal cord adjacent to T4, T7, T10 and T12.  None of the foci enhance.  No significant degenerative change.  Laboratory: 03/2022:  JCV DNA PCR negative.  HIV negative REVIEW OF SYSTEMS: Constitutional: No fevers, chills, sweats, or change in appetite Eyes: No visual changes, double vision, eye pain Ear, nose and throat: No hearing loss, ear pain, nasal congestion, sore throat Cardiovascular: No chest pain, palpitations Respiratory:  No shortness of breath at rest or with exertion.   No wheezes GastrointestinaI: No nausea, vomiting, diarrhea, abdominal pain, fecal incontinence Genitourinary:  No dysuria, urinary retention or frequency.  No nocturia. Musculoskeletal:  No neck pain, back pain Integumentary:  No rash, pruritus, skin lesions Neurological: as above Psychiatric: No depression at this time.  No anxiety Endocrine: No palpitations, diaphoresis, change in appetite, change in weigh or increased thirst Hematologic/Lymphatic:  No anemia, purpura, petechiae. Allergic/Immunologic: No itchy/runny eyes, nasal congestion, recent allergic reactions, rashes  ALLERGIES: No Known Allergies  HOME MEDICATIONS:  Current Outpatient Medications:    MAGNESIUM PO, Take 1 tablet by mouth daily., Disp: , Rfl:   PAST MEDICAL HISTORY: Past Medical History:  Diagnosis Date   Seasonal allergies     PAST SURGICAL HISTORY: No past surgical history on file.  FAMILY HISTORY: Family History  Problem Relation Age of Onset   Multiple sclerosis Maternal Great-grandmother     SOCIAL HISTORY:  Social History   Socioeconomic History   Marital status: Single    Spouse name: Not on file   Number of children: Not on file   Years of education: Not on file   Highest education level: Not on file  Occupational History   Not on file  Tobacco Use   Smoking status: Never   Smokeless tobacco: Never  Substance and Sexual Activity   Alcohol use: Not Currently    Comment: Occasional   Drug use: Yes    Types: Marijuana   Sexual activity: Not on file  Other Topics Concern   Not on file  Social History Narrative   Not on file   Social Determinants of Health   Financial Resource Strain: Not on file  Food Insecurity: Not on file  Transportation Needs: Not on file  Physical Activity: Not on file  Stress: Not on file  Social Connections: Not on file  Intimate Partner Violence: Not on file     PHYSICAL EXAM  There were no vitals  filed for this visit.  There is no height or weight on file to calculate BMI.   General: The patient is well-developed and well-nourished and in no acute distress  HEENT:  Head is [place]/AT.  Sclera are anicteric.  Funduscopic exam shows normal optic discs and retinal  vessels.  Neck: No carotid bruits are noted.  The neck is nontender.  Cardiovascular: The heart has a regular rate and rhythm with a normal S1 and S2. There were no murmurs, gallops or rubs.    Skin: Extremities are without rash or  edema.  Musculoskeletal:  Back is nontender  Neurologic Exam  Mental status: The patient is alert and oriented x 3 at the time of the examination. The patient has apparent normal recent and remote memory, with an apparently normal attention span and concentration ability.   Speech is normal.  Cranial nerves: Extraocular movements are full. Pupils are equal, round, and reactive to light and accomodation.  Visual fields are full.  Facial symmetry is present. There is good facial sensation to soft touch bilaterally.Facial strength is normal.  Trapezius and sternocleidomastoid strength is normal. No dysarthria is noted.  The tongue is midline, and the patient has symmetric elevation of the soft palate. No obvious hearing deficits are noted.  Motor:  Muscle bulk is normal.   Tone is normal. Strength is  5 / 5 in all 4 extremities.   Sensory: Sensory testing is intact to pinprick, soft touch and vibration sensation in all 4 extremities.  Coordination: Cerebellar testing reveals good finger-nose-finger and heel-to-shin bilaterally.  Gait and station: Station is normal.   Gait is normal. Tandem gait is normal. Romberg is negative.   Reflexes: Deep tendon reflexes are symmetric and normal bilaterally.   Plantar responses are flexor.    DIAGNOSTIC DATA (LABS, IMAGING, TESTING) - I reviewed patient records, labs, notes, testing and imaging myself where available.  Lab Results  Component Value Date   WBC 10.5 04/15/2022   HGB 12.2 04/15/2022   HCT 37.4 04/15/2022   MCV 87.4 04/15/2022   PLT 273 04/15/2022      Component Value Date/Time   NA 138 04/15/2022 0143   K 4.5 04/15/2022 0143   CL 108 04/15/2022 0143   CO2 25 04/15/2022 0143   GLUCOSE 126 (H)  04/15/2022 0143   BUN 11 04/15/2022 0143   CREATININE 0.85 04/15/2022 0143   CALCIUM 8.8 (L) 04/15/2022 0143   PROT 7.3 04/10/2022 2134   ALBUMIN 3.8 04/10/2022 2134   AST 14 (L) 04/10/2022 2134   ALT 15 04/10/2022 2134   ALKPHOS 46 04/10/2022 2134   BILITOT 1.0 04/10/2022 2134   GFRNONAA >60 04/15/2022 0143   No results found for: CHOL, HDL, LDLCALC, LDLDIRECT, TRIG, CHOLHDL No results found for: TIRW4R No results found for: VITAMINB12 Lab Results  Component Value Date   TSH 0.937 04/11/2022       ASSESSMENT AND PLAN  ***   Sarvesh Meddaugh A. Epimenio Foot, MD, [facility] 04/23/2022, 6:11 PM Certified in Neurology, Clinical Neurophysiology, Sleep Medicine and Neuroimaging  [facility] [address], [address] [place], [place] [address] [phone]

## 2022-04-24 ENCOUNTER — Telehealth: Payer: Self-pay | Admitting: Neurology

## 2022-04-24 ENCOUNTER — Encounter: Payer: Self-pay | Admitting: Neurology

## 2022-04-24 ENCOUNTER — Ambulatory Visit (INDEPENDENT_AMBULATORY_CARE_PROVIDER_SITE_OTHER): Payer: No Typology Code available for payment source | Admitting: Neurology

## 2022-04-24 VITALS — BP 128/68 | HR 64 | Ht 66.0 in | Wt 156.0 lb

## 2022-04-24 DIAGNOSIS — G35 Multiple sclerosis: Secondary | ICD-10-CM | POA: Diagnosis not present

## 2022-04-24 DIAGNOSIS — Z298 Encounter for other specified prophylactic measures: Secondary | ICD-10-CM

## 2022-04-24 DIAGNOSIS — R269 Unspecified abnormalities of gait and mobility: Secondary | ICD-10-CM | POA: Diagnosis not present

## 2022-04-24 DIAGNOSIS — E559 Vitamin D deficiency, unspecified: Secondary | ICD-10-CM

## 2022-04-24 DIAGNOSIS — Z79899 Other long term (current) drug therapy: Secondary | ICD-10-CM | POA: Diagnosis not present

## 2022-04-24 DIAGNOSIS — R200 Anesthesia of skin: Secondary | ICD-10-CM

## 2022-04-24 DIAGNOSIS — G35D Multiple sclerosis, unspecified: Secondary | ICD-10-CM

## 2022-04-24 DIAGNOSIS — Z2989 Encounter for other specified prophylactic measures: Secondary | ICD-10-CM

## 2022-04-24 DIAGNOSIS — G373 Acute transverse myelitis in demyelinating disease of central nervous system: Secondary | ICD-10-CM

## 2022-04-24 NOTE — Telephone Encounter (Signed)
Placed JCV lab in quest lock box for routine lab pick up. Results pending. 

## 2022-04-25 ENCOUNTER — Encounter: Payer: Self-pay | Admitting: Neurology

## 2022-04-28 LAB — IGG, IGA, IGM
IgA/Immunoglobulin A, Serum: 103 mg/dL (ref 87–352)
IgG (Immunoglobin G), Serum: 1318 mg/dL (ref 586–1602)
IgM (Immunoglobulin M), Srm: 117 mg/dL (ref 26–217)

## 2022-04-28 LAB — QUANTIFERON-TB GOLD PLUS
QuantiFERON Mitogen Value: >10 IU/mL
QuantiFERON Nil Value: 0.06 IU/mL
QuantiFERON TB1 Ag Value: 0.06 IU/mL
QuantiFERON TB2 Ag Value: 0.09 IU/mL
QuantiFERON-TB Gold Plus: NEGATIVE

## 2022-04-28 LAB — HEPATITIS B SURFACE ANTIBODY,QUALITATIVE: Hep B Surface Ab, Qual: REACTIVE

## 2022-04-28 LAB — ANTI-MOG, SERUM: MOG Antibody, Cell-based IFA: POSITIVE — AB

## 2022-04-28 LAB — HEPATITIS C ANTIBODY: Hep C Virus Ab: NONREACTIVE

## 2022-04-28 LAB — HEPATITIS B CORE ANTIBODY, TOTAL: Hep B Core Total Ab: NEGATIVE

## 2022-04-28 LAB — HEPATITIS B SURFACE ANTIGEN: Hepatitis B Surface Ag: NEGATIVE

## 2022-04-28 LAB — VARICELLA ZOSTER ANTIBODY, IGG: Varicella zoster IgG: 612 index (ref >165)

## 2022-04-28 LAB — NEUROMYELITIS OPTICA AUTOAB, IGG: NMO IgG Autoantibodies: <1.5 U/mL (ref 0.0–3.0)

## 2022-04-28 LAB — VITAMIN D 25 HYDROXY (VIT D DEFICIENCY, FRACTURES): Vit D, 25-Hydroxy: 24.2 ng/mL — ABNORMAL LOW (ref 30.0–100.0)

## 2022-04-28 LAB — ANTI-MOG ANTIBODY TITER, SERUM: Anti-MOG Antibody Titer, Serum: 1:32 {titer}

## 2022-04-30 ENCOUNTER — Telehealth: Payer: Self-pay | Admitting: Neurology

## 2022-04-30 DIAGNOSIS — G378 Other specified demyelinating diseases of central nervous system: Secondary | ICD-10-CM

## 2022-04-30 NOTE — Telephone Encounter (Signed)
Lab work came back.  The anti-MOG antibody was elevated.  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  She also has lesions on the brain MRI.  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  Because of the severity of the transverse myelitis, I recommend that we initiate a disease modifying therapy for her MOGAD.  There are no FDA approved medications.  Actemra and Rituxan of both possible treatments.  Data appears to be a little better with Actemra and I will see if we can get this covered for her.  Additionally her vitamin D was low.  I recommend that she take 5000 units OTC daily.  I called to discuss these findings with her and got voicemail so left a message.  I will try to reach her again tomorrow.

## 2022-05-01 NOTE — Telephone Encounter (Signed)
JCV ab drawn on 04/24/22 indeterminate, index: 0.26. Inhibition assay: negative.

## 2022-05-02 NOTE — Telephone Encounter (Signed)
Faxed completed/signed Actemra start form to [employer] at [phone]. Received fax confirmation.   Order: Actemra SQ autoinjector, inject 162mg  once a week. 90 days supply w/ 3 refills. Dx codes: G65.8.

## 2022-05-02 NOTE — Telephone Encounter (Signed)
Pt returned phone call, would like a call back.  

## 2022-05-02 NOTE — Telephone Encounter (Signed)
I called back and got voicemail.  Left a message and will try to reach her again later.

## 2022-05-06 NOTE — Telephone Encounter (Signed)
Received fax from [place] that pt consent form needs signing. I emailed form to pt at [email or web address]. I called pt to let her know. She will email completed form back asap.

## 2022-05-06 NOTE — Telephone Encounter (Signed)
Gave completed/signed form back to medical records to process for pt. 

## 2022-05-15 NOTE — Telephone Encounter (Signed)
PA Actemra submitted on CMM. Key: [id number]. Waiting on determination from [facility].

## 2022-05-16 NOTE — Telephone Encounter (Signed)
Received fax from [facility] asking for more questions to be answered/2 peer reviewed articles. This was completed and faxed back to them at [phone]. Received fax confirmation.

## 2022-05-20 NOTE — Telephone Encounter (Signed)
I called [facility]. The PA updates have not been reviewed yet. The tech has expedited this PA. It has be reviewed by an outside company, [employer].

## 2022-05-24 ENCOUNTER — Encounter: Payer: Self-pay | Admitting: Neurology

## 2022-05-27 ENCOUNTER — Encounter: Payer: Self-pay | Admitting: Neurology

## 2022-05-29 ENCOUNTER — Telehealth: Payer: Self-pay | Admitting: Neurology

## 2022-05-29 ENCOUNTER — Other Ambulatory Visit: Payer: Self-pay | Admitting: Neurology

## 2022-05-29 MED ORDER — PREDNISONE 50 MG PO TABS: ORAL_TABLET | ORAL | 0 refills | Status: DC

## 2022-05-29 NOTE — Telephone Encounter (Signed)
Spoke with Dr. Epimenio Foot. He is going to review pt chart and let us know recommendation once has done this

## 2022-05-29 NOTE — Telephone Encounter (Signed)
Replied to pt mychart message. Waiting to speak with MD about recommendation. Advised pt we will be back in touch once I can speak with MD

## 2022-05-29 NOTE — Telephone Encounter (Signed)
Pt has called in crying due to experiencing a MS Flare up, pt asking if she can come in for treatment, please call.

## 2022-05-30 ENCOUNTER — Ambulatory Visit: Payer: No Typology Code available for payment source | Admitting: Neurology

## 2022-05-30 NOTE — Telephone Encounter (Signed)
Called [facility] at [phone]. Spoke w/ Billy(pharm tech). States appeal still under review. Should have determination by today.

## 2022-05-31 NOTE — Telephone Encounter (Signed)
Mendel Ryder Hilda Lias) called, will have to complete a additional form for the foundation. Have faxed the form to your office. If have questions can call back.

## 2022-06-03 ENCOUNTER — Other Ambulatory Visit: Payer: Self-pay | Admitting: *Deleted

## 2022-06-03 ENCOUNTER — Encounter: Payer: Self-pay | Admitting: *Deleted

## 2022-06-03 ENCOUNTER — Other Ambulatory Visit: Payer: Self-pay | Admitting: Neurology

## 2022-06-03 MED ORDER — GABAPENTIN 300 MG PO CAPS: 300.0000 mg | ORAL_CAPSULE | Freq: Three times a day (TID) | ORAL | 3 refills | Status: DC

## 2022-06-03 NOTE — Telephone Encounter (Signed)
Faxed patient assistance form to [employer]. Received a receipt of confirmation.

## 2022-06-03 NOTE — Telephone Encounter (Signed)
Received [employer] patient assistance form from [place]. Completed and given to Dr. Epimenio Foot for review and signature.

## 2022-06-06 ENCOUNTER — Encounter: Payer: Self-pay | Admitting: Neurology

## 2022-06-06 NOTE — Telephone Encounter (Signed)
Received fax from [place] that patient approved to receive Actemra free of charge. Sent copy to be scanned by MR to her chart as well.

## 2022-06-24 NOTE — Telephone Encounter (Signed)
I called patient to find out if she received Actemra and if she is tolerating it well. No answer, left a message asking her to call us back.

## 2022-06-25 NOTE — Telephone Encounter (Signed)
I called patient. She started Actemra on 06/20/22 and is tolerating it well. She made a follow up appointment for 09/23/22 at 1:30pm. Patient will let us know of interim questions or concerns.

## 2022-09-23 ENCOUNTER — Encounter: Payer: Self-pay | Admitting: Neurology

## 2022-09-23 ENCOUNTER — Ambulatory Visit: Payer: PRIVATE HEALTH INSURANCE | Admitting: Neurology

## 2022-09-27 ENCOUNTER — Other Ambulatory Visit: Payer: Self-pay | Admitting: Neurology

## 2022-12-08 ENCOUNTER — Other Ambulatory Visit: Payer: Self-pay | Admitting: Neurology

## 2022-12-09 ENCOUNTER — Encounter: Payer: Self-pay | Admitting: Neurology

## 2022-12-09 ENCOUNTER — Other Ambulatory Visit: Payer: Self-pay | Admitting: Neurology

## 2023-01-07 ENCOUNTER — Other Ambulatory Visit: Payer: Self-pay | Admitting: Neurology

## 2023-02-04 ENCOUNTER — Encounter: Payer: Self-pay | Admitting: *Deleted

## 2023-02-04 NOTE — Telephone Encounter (Signed)
Faxed completed/signed rx refill to [facility] qty 12, 0 refills at [phone]. Received fax confirmation.  Pt taking Actemra actpen '162mg'$ /0.47m once a week.

## 2023-02-09 ENCOUNTER — Other Ambulatory Visit: Payer: Self-pay | Admitting: Neurology

## 2023-02-10 NOTE — Telephone Encounter (Signed)
Last seen on 04/25/23 Follow up scheduled on 04/01/23

## 2023-02-13 ENCOUNTER — Encounter: Payer: Self-pay | Admitting: Neurology

## 2023-02-17 ENCOUNTER — Other Ambulatory Visit (HOSPITAL_COMMUNITY): Payer: Self-pay

## 2023-02-17 MED ORDER — GABAPENTIN 300 MG PO CAPS
300.0000 mg | ORAL_CAPSULE | Freq: Three times a day (TID) | ORAL | 1 refills | Status: DC
  Filled 2023-02-17 (×2): qty 90, 30d supply, fill #0
  Filled 2023-03-18: qty 90, 30d supply, fill #1

## 2023-03-25 ENCOUNTER — Other Ambulatory Visit (HOSPITAL_COMMUNITY): Payer: Self-pay

## 2023-03-25 MED ORDER — SERTRALINE HCL 50 MG PO TABS
50.0000 mg | ORAL_TABLET | Freq: Every day | ORAL | 0 refills | Status: DC
  Filled 2023-03-25: qty 90, 90d supply, fill #0

## 2023-04-01 ENCOUNTER — Encounter: Payer: Self-pay | Admitting: Neurology

## 2023-04-01 ENCOUNTER — Other Ambulatory Visit (HOSPITAL_COMMUNITY): Payer: Self-pay

## 2023-04-01 ENCOUNTER — Ambulatory Visit: Payer: 59 | Admitting: Neurology

## 2023-04-01 VITALS — BP 115/81 | HR 62 | Ht 66.0 in | Wt 150.4 lb

## 2023-04-01 DIAGNOSIS — Z79899 Other long term (current) drug therapy: Secondary | ICD-10-CM | POA: Diagnosis not present

## 2023-04-01 DIAGNOSIS — G3781 Myelin oligodendrocyte glycoprotein antibody disease: Secondary | ICD-10-CM

## 2023-04-01 DIAGNOSIS — E559 Vitamin D deficiency, unspecified: Secondary | ICD-10-CM | POA: Diagnosis not present

## 2023-04-01 DIAGNOSIS — M21371 Foot drop, right foot: Secondary | ICD-10-CM | POA: Diagnosis not present

## 2023-04-01 MED ORDER — SERTRALINE HCL 100 MG PO TABS
50.0000 mg | ORAL_TABLET | Freq: Every day | ORAL | 4 refills | Status: DC
  Filled 2023-04-01: qty 45, 90d supply, fill #0
  Filled 2023-06-30: qty 45, 90d supply, fill #1
  Filled 2023-09-25: qty 45, 90d supply, fill #2
  Filled 2023-12-24: qty 45, 90d supply, fill #3

## 2023-04-01 MED ORDER — GABAPENTIN 400 MG PO CAPS
400.0000 mg | ORAL_CAPSULE | Freq: Four times a day (QID) | ORAL | 4 refills | Status: DC
  Filled 2023-04-01: qty 360, 90d supply, fill #0
  Filled 2023-06-30: qty 360, 90d supply, fill #1
  Filled 2023-09-25: qty 360, 90d supply, fill #2
  Filled 2023-12-24: qty 360, 90d supply, fill #3

## 2023-04-01 NOTE — Progress Notes (Addendum)
[facility]  PATIENT: Robin Mercer DOB: November 11, 2001  REFERRING DOCTOR OR PCP: Mare Loan, MD SOURCE: Patient, notes from recent hospitalization, imaging and lab reports, multiple MRI images personally reviewed.  _________________________________   HISTORICAL  CHIEF COMPLAINT:  Chief Complaint  Patient presents with   Room 10    Pt is here Alone. Pt states that her right leg has some tingling. Pt wants to up her dose of gabapentin. Pt also wants to get a referral for PT.     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     Currently, she has mild right leg weakness and mild reduced gait.   She is better than last year.   She could easily walk a mile and mostly keeps up with others.    She has had no falls.     She still has right leg numbness and tingling, also better since the last visit    Bladder function has improved back o normal..   Vision is fine.     She notes no major issue with fatigue.    She sleeps well most nights.    She sometimes has mild depressed.   She feels cognition is doing well.    She is in school for nursing.     History of demyelinating disease: She had right calf weakness starting in spring 2020.    The onset was gradual and she was fairly stable the next 2 - 3 years.    In May 2023 she had the onset of numbness in her right leg and gait was worse.    The paresthesias worsened when se walked further.   She also had urinary hesitancy.   Due to the worse symptoms, she went to the ED and was admitted for 5 days of IV Solumedrol.   She felt she improved initially but symptoms are worse now than at discharge.      She ran track in high school and had no symptoms before age 76.    Imaging reviewed: MRI of the brain 04/11/2022 showed multiple T2/FLAIR hyperintense foci in the periventricular, juxtacortical and deep white  matter.  None of the foci enhanced after contrast.  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  MRI of the lumbar spine 04/11/2022: There are T2 hyperintense foci adjacent to T10 and adjacent to T11-T12 (posterior).  They do not enhance.  MRI of the cervical and thoracic spine 04/13/2022 shows a longitudinally extensive T2 hyperintense focus from C1-C2 caudally to C7.  Additionally, there are T2 hyperintense foci within the thoracic spinal cord adjacent to T4, T7, T10 and T12.  None of the foci enhance.  No significant degenerative change.  Laboratory: 03/2022:  JCV DNA PCR negative.  HIV negative REVIEW OF SYSTEMS: Constitutional: No fevers, chills, sweats, or change in appetite Eyes: No visual changes, double vision, eye pain Ear, nose and throat: No hearing loss, ear pain, nasal congestion, sore throat Cardiovascular: No chest pain, palpitations Respiratory:  No shortness of breath at rest or with exertion.   No wheezes GastrointestinaI: No nausea, vomiting, diarrhea, abdominal pain, fecal incontinence Genitourinary:  No dysuria, urinary retention or frequency.  No nocturia. Musculoskeletal:  No neck pain, back pain Integumentary: No rash, pruritus, skin lesions Neurological: as above Psychiatric: No depression at this time.  No anxiety Endocrine: No palpitations, diaphoresis, change in appetite, change in weigh  or increased thirst Hematologic/Lymphatic:  No anemia, purpura, petechiae. Allergic/Immunologic: No itchy/runny eyes, nasal congestion, recent allergic reactions, rashes  ALLERGIES: No Known Allergies  HOME MEDICATIONS:  Current Outpatient Medications:    Tocilizumab (ACTEMRA ACTPEN) 162 MG/0.9ML SOAJ, Inject 162 mg into the skin once a week., Disp: , Rfl:    gabapentin (NEURONTIN) 400 MG capsule, Take 1 capsule (400 mg total) by mouth 4 (four) times daily., Disp: 360 capsule, Rfl: 4   MAGNESIUM PO, Take 1 tablet by mouth daily. (Patient not taking: Reported on 04/01/2023), Disp: , Rfl:     sertraline (ZOLOFT) 100 MG tablet, Take 0.5 tablets (50 mg total) by mouth daily., Disp: 90 tablet, Rfl: 4  PAST MEDICAL HISTORY: Past Medical History:  Diagnosis Date   Seasonal allergies     PAST SURGICAL HISTORY: History reviewed. No pertinent surgical history.  FAMILY HISTORY: Family History  Problem Relation Age of Onset   Hypertension Father    Multiple sclerosis Maternal Great-grandmother     SOCIAL HISTORY:  Social History   Socioeconomic History   Marital status: Single    Spouse name: Not on file   Number of children: Not on file   Years of education: Not on file   Highest education level: High school graduate  Occupational History   Not on file  Tobacco Use   Smoking status: Never   Smokeless tobacco: Never  Substance and Sexual Activity   Alcohol use: Not Currently    Comment: Occasional   Drug use: Yes    Types: Marijuana    Comment: 1-2 a day   Sexual activity: Not on file  Other Topics Concern   Not on file  Social History Narrative   Lives with parents and siblings   R handed   Caffeine: rarely    Social Determinants of Health   Financial Resource Strain: Not on file  Food Insecurity: Not on file  Transportation Needs: Not on file  Physical Activity: Not on file  Stress: Not on file  Social Connections: Not on file  Intimate Partner Violence: Not on file     PHYSICAL EXAM  Vitals:   04/01/23 1300  BP: 115/81  Pulse: 62  Weight: 150 lb 6.4 oz (68.2 kg)  Height: 5\' 6"  (1.676 m)    Body mass index is 24.28 kg/m.   General: The patient is well-developed and well-nourished and in no acute distress  HEENT:  Head is [place]/AT.  Sclera are anicteric.    Skin: Extremities are without rash or  edema.  Musculoskeletal:  Back is nontender  Neurologic Exam  Mental status: The patient is alert and oriented x 3 at the time of the examination. The patient has apparent normal recent and remote memory, with an apparently normal attention  span and concentration ability.   Speech is normal.  Cranial nerves: Extraocular movements are full. Pupils are equal, round, and reactive to light and accomodation.  Visual fields are full.  Facial symmetry is present. There is good facial sensation to soft touch bilaterally.Facial strength is normal.  Trapezius and sternocleidomastoid strength is normal. No dysarthria is noted.  The tongue is midline, and the patient has symmetric elevation of the soft palate. No obvious hearing deficits are noted.  Motor:  Muscle bulk is normal.   Tone is slightly increased in legs. Strength is  5 / 5 in all 4 extremities.   Sensory: Sensory testing is intact to pinprick, soft touch and vibration sensation in all 4 extremities.  Coordination:  Cerebellar testing reveals good finger-nose-finger and reduced right heel-to-shin .  Gait and station: Station is normal.   Gait is mildly right spastic and mildly wide . Tandem gait is poor. Romberg is negative.   Reflexes: Deep tendon reflexes are increased in legs, right > left (spread 2 beats nonsustained clonus) and normal bilaterally.   Plantar responses are flexor left and extensor right.    DIAGNOSTIC DATA (LABS, IMAGING, TESTING) - I reviewed patient records, labs, notes, testing and imaging myself where available.  Lab Results  Component Value Date   WBC 10.5 04/15/2022   HGB 12.2 04/15/2022   HCT 37.4 04/15/2022   MCV 87.4 04/15/2022   PLT 273 04/15/2022      Component Value Date/Time   NA 138 04/15/2022 0143   K 4.5 04/15/2022 0143   CL 108 04/15/2022 0143   CO2 25 04/15/2022 0143   GLUCOSE 126 (H) 04/15/2022 0143   BUN 11 04/15/2022 0143   CREATININE 0.85 04/15/2022 0143   CALCIUM 8.8 (L) 04/15/2022 0143   PROT 7.3 04/10/2022 2134   ALBUMIN 3.8 04/10/2022 2134   AST 14 (L) 04/10/2022 2134   ALT 15 04/10/2022 2134   ALKPHOS 46 04/10/2022 2134   BILITOT 1.0 04/10/2022 2134   GFRNONAA >60 04/15/2022 0143    Lab Results  Component Value  Date   TSH 0.937 04/11/2022       ASSESSMENT AND PLAN  Myelin oligodendrocyte glycoprotein antibody disorder (MOGAD) - Plan: Lipid Panel, CBC with Differential/Platelet, Comprehensive metabolic panel, Anti-MOG, Serum, Ambulatory referral to Physical Therapy, PR STATIC OR DYNAMI AFO PRE CST  High risk medication use - Plan: Lipid Panel, CBC with Differential/Platelet, Comprehensive metabolic panel  Right foot drop - Plan: Ambulatory referral to Physical Therapy, PR STATIC OR DYNAMI AFO PRE CST  Vitamin D deficiency - Plan: VITAMIN D 25 Hydroxy (Vit-D Deficiency, Fractures)   Continue Actemra, check labs She has a right foot drop.  Refer to PT for therapy.  She will benefit from a right AFO Stay active and exercise as tolerated Rtc 6 months, sooner if new or worsening neurologic symptom.   Shantasia Hunnell A. Epimenio Foot, MD, [facility] 04/01/2023, 1:35 PM Certified in Neurology, Clinical Neurophysiology, Sleep Medicine and Neuroimaging  [facility] [address], [address] [place], [place] [address] [phone]

## 2023-04-02 ENCOUNTER — Other Ambulatory Visit: Payer: Self-pay | Admitting: Neurology

## 2023-04-02 LAB — COMPREHENSIVE METABOLIC PANEL
ALT: 15 IU/L (ref 0–32)
AST: 15 IU/L (ref 0–40)
Albumin/Globulin Ratio: 1.7 (ref 1.2–2.2)
Albumin: 4.4 g/dL (ref 4.0–5.0)
Alkaline Phosphatase: 52 IU/L (ref 44–121)
BUN/Creatinine Ratio: 9 (ref 9–23)
BUN: 7 mg/dL (ref 6–20)
Bilirubin Total: 0.4 mg/dL (ref 0.0–1.2)
CO2: 21 mmol/L (ref 20–29)
Calcium: 9.4 mg/dL (ref 8.7–10.2)
Chloride: 104 mmol/L (ref 96–106)
Creatinine, Ser: 0.77 mg/dL (ref 0.57–1.00)
Globulin, Total: 2.6 g/dL (ref 1.5–4.5)
Glucose: 86 mg/dL (ref 70–99)
Potassium: 4.1 mmol/L (ref 3.5–5.2)
Sodium: 140 mmol/L (ref 134–144)
Total Protein: 7 g/dL (ref 6.0–8.5)
eGFR: 112 mL/min/{1.73_m2} (ref >59)

## 2023-04-02 LAB — VITAMIN D 25 HYDROXY (VIT D DEFICIENCY, FRACTURES): Vit D, 25-Hydroxy: 13 ng/mL — ABNORMAL LOW (ref 30.0–100.0)

## 2023-04-02 LAB — CBC WITH DIFFERENTIAL/PLATELET
Basophils Absolute: 0 10*3/uL (ref 0.0–0.2)
Basos: 1 %
EOS (ABSOLUTE): 0.1 10*3/uL (ref 0.0–0.4)
Eos: 1 %
Hematocrit: 41.6 % (ref 34.0–46.6)
Hemoglobin: 13.8 g/dL (ref 11.1–15.9)
Immature Grans (Abs): 0 10*3/uL (ref 0.0–0.1)
Immature Granulocytes: 0 %
Lymphocytes Absolute: 2 10*3/uL (ref 0.7–3.1)
Lymphs: 33 %
MCH: 29.7 pg (ref 26.6–33.0)
MCHC: 33.2 g/dL (ref 31.5–35.7)
MCV: 90 fL (ref 79–97)
Monocytes Absolute: 0.4 10*3/uL (ref 0.1–0.9)
Monocytes: 7 %
Neutrophils Absolute: 3.6 10*3/uL (ref 1.4–7.0)
Neutrophils: 58 %
Platelets: 292 10*3/uL (ref 150–450)
RBC: 4.65 x10E6/uL (ref 3.77–5.28)
RDW: 13 % (ref 11.7–15.4)
WBC: 6.2 10*3/uL (ref 3.4–10.8)

## 2023-04-02 LAB — LIPID PANEL
Chol/HDL Ratio: 2.7 ratio (ref 0.0–4.4)
Cholesterol, Total: 177 mg/dL (ref 100–199)
HDL: 65 mg/dL (ref >39)
LDL Chol Calc (NIH): 99 mg/dL (ref 0–99)
Triglycerides: 67 mg/dL (ref 0–149)
VLDL Cholesterol Cal: 13 mg/dL (ref 5–40)

## 2023-04-02 MED ORDER — VITAMIN D (ERGOCALCIFEROL) 1.25 MG (50000 UNIT) PO CAPS: 50000.0000 [IU] | ORAL_CAPSULE | ORAL | 3 refills | Status: DC

## 2023-04-02 NOTE — Progress Notes (Signed)
Order low vit D

## 2023-04-03 LAB — ANTI-MOG, SERUM: MOG Antibody, Cell-based IFA: POSITIVE — AB

## 2023-04-03 LAB — ANTI-MOG ANTIBODY TITER, SERUM: Anti-MOG Antibody Titer, Serum: 1:32 {titer}

## 2023-04-04 DIAGNOSIS — F411 Generalized anxiety disorder: Secondary | ICD-10-CM | POA: Diagnosis not present

## 2023-04-04 DIAGNOSIS — Z23 Encounter for immunization: Secondary | ICD-10-CM | POA: Diagnosis not present

## 2023-04-04 DIAGNOSIS — G3781 Myelin oligodendrocyte glycoprotein antibody disease: Secondary | ICD-10-CM | POA: Diagnosis not present

## 2023-04-04 DIAGNOSIS — Z0000 Encounter for general adult medical examination without abnormal findings: Secondary | ICD-10-CM | POA: Diagnosis not present

## 2023-04-04 NOTE — Therapy (Signed)
OUTPATIENT PHYSICAL THERAPY NEURO EVALUATION   Patient Name: Robin Mercer MRN: [id number] DOB:01-24-2001, 22 y.o., female Today's Date: 04/07/2023   PCP: Carin Hock, PA  REFERRING PROVIDER: Asa Lente, MD   END OF SESSION:  PT End of Session - 04/07/23 0812     Visit Number 1    Number of Visits 9    Date for PT Re-Evaluation 06/06/23    Authorization Type [employer]    PT Start Time [phone]   pt late to eval   PT Stop Time 0845    PT Time Calculation (min) 34 min    Activity Tolerance Patient tolerated treatment well    Behavior During Therapy [facility] for tasks assessed/performed             Past Medical History:  Diagnosis Date   Seasonal allergies    History reviewed. No pertinent surgical history. 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   Right foot drop 04/01/2023   High risk medication use 04/01/2023   Vitamin D deficiency 04/01/2023   Pyuria 04/13/2022   Marijuana use 04/11/2022   Multiple sclerosis (HCC) 04/11/2022    ONSET DATE: 04/01/2023 (date of referral)   REFERRING DIAG: G37.81 (ICD-10-CM) - Myelin oligodendrocyte glycoprotein antibody disorder (MOGAD) M21.371 (ICD-10-CM) - Right foot drop   THERAPY DIAG:  Foot drop, right  Muscle weakness (generalized)  Unsteadiness on feet  Other abnormalities of gait and mobility  Rationale for Evaluation and Treatment: Rehabilitation  SUBJECTIVE:                                                                                                                                                                                             SUBJECTIVE STATEMENT: Wants to strengthen her R leg. Going up and down stairs is challenging. Used to do track (sprints, shotput) and hasn't ran in 4 years. Has trouble keeping her R toes up when she is walking. Just tripping over her R foot is an issue. Reports she has to  think about picking it up.Reports when her anxiety gets worse, her symptoms get worse as well.   Pt accompanied by: self  PERTINENT HISTORY: MOGAD, Anxiety  Per Dr. Epimenio Foot: Currently, she has mild right leg weakness and mild reduced gait. She is better than last year.  She could easily walk a mile and mostly keeps up with others. She has had no falls. She still has right leg numbness and tingling, also better since the last visit. Bladder function has improved back to normal.  Vision is fine.   On Actemra  once a week since June 2023   PAIN:  Are you having pain? Yes: NPRS scale: 4/10 Pain location: RLE Pain description: Nerve pain Aggravating factors: Nothing Relieving factors: Nothing  PRECAUTIONS: None  WEIGHT BEARING RESTRICTIONS: No  FALLS: Has patient fallen in last 6 months? 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   PATIENT GOALS: Strengthen R leg, improve stairs, going back to running   OBJECTIVE:   DIAGNOSTIC FINDINGS: MRI of the brain 04/11/2022 showed multiple T2/FLAIR hyperintense foci in the periventricular, juxtacortical and deep white matter. None of the foci enhanced after contrast. 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  MRI of the lumbar spine 04/11/2022: There are T2 hyperintense foci adjacent to T10 and adjacent to T11-T12 (posterior). They do not enhance.  COGNITION: Overall cognitive status: Within functional limits for tasks assessed   SENSATION: Light touch: Impaired  and With RLE, pt with numbness/tingling in R thigh and decr light touch more distally.   COORDINATION: Heel to shin: WFL    POSTURE: No Significant postural limitations   LOWER EXTREMITY MMT:    MMT  Right Eval Left Eval  Hip flexion 4 5  Hip extension    Hip abduction 3- 5  Hip adduction 5 5  Hip internal rotation    Hip external rotation    Knee flexion 4+ 5  Knee extension 4+ 4+  Ankle dorsiflexion 3- 5  Ankle plantarflexion    Ankle inversion    Ankle eversion    (Blank rows = not tested)  All tested in sitting   TRANSFERS: Assistive device utilized: None  Sit to stand: Complete Independence Stand to sit: Complete Independence   STAIRS: Level of Assistance: SBA Stair Negotiation Technique: Alternating Pattern  with No Rails Number of Stairs: 8 Height of Stairs: 6  Comments: Pt with decr eccentric control when lowering and needs to hit the bars intermittently for balance.   GAIT: Gait pattern: step through pattern, decreased ankle dorsiflexion- Right, and poor foot clearance- Right Distance walked: Clinic distances, 115' x 1  Assistive device utilized: None Level of assistance: Modified independence and SBA Comments: Trialed an additional 4' with use of  R foot up brace with pt demonstrating improved gait mechanics and RLE heel strike. Pt reporting good improvement with this and really liking it. Showed pt where she can purchase from [employer].  FUNCTIONAL TESTS:  5 times sit to stand: 14.5 seconds  30 seconds chair stand test: 10 sit <> stands in 30 seconds, pt reporting no fatigue afterwards   SLS, LLE: at least 20 seconds with mild sway, RLE: 3.3 seconds     [facility] PT Assessment - 04/07/23 0837       Ambulation/Gait   Gait velocity 12.78 seconds = 2.57 ft/sec      Functional Gait  Assessment   Gait assessed  Yes    Gait Level Surface Walks 20 ft, slow speed, abnormal gait pattern, evidence for imbalance or deviates 10-15 in outside of the 12 in walkway width. Requires more than 7 sec to ambulate 20 ft.   8.59   Change in Gait Speed Able to change speed, demonstrates mild gait deviations, deviates 6-10 in outside of the 12 in walkway width, or no gait  deviations, unable to  achieve a major change in velocity, or uses a change in velocity, or uses an assistive device.    Gait with Horizontal Head Turns Performs head turns with moderate changes in gait velocity, slows down, deviates 10-15 in outside 12 in walkway width but recovers, can continue to walk.    Gait with Vertical Head Turns Performs task with slight change in gait velocity (eg, minor disruption to smooth gait path), deviates 6 - 10 in outside 12 in walkway width or uses assistive device    Gait and Pivot Turn Pivot turns safely within 3 sec and stops quickly with no loss of balance.    Step Over Obstacle Is able to step over one shoe box (4.5 in total height) without changing gait speed. No evidence of imbalance.    Gait with Narrow Base of Support Ambulates 7-9 steps.    Gait with Eyes Closed Cannot walk 20 ft without assistance, severe gait deviations or imbalance, deviates greater than 15 in outside 12 in walkway width or will not attempt task.    Ambulating Backwards Walks 20 ft, slow speed, abnormal gait pattern, evidence for imbalance, deviates 10-15 in outside 12 in walkway width.   23 seconds   Steps Alternating feet, must use rail.    Total Score 16    FGA comment: 16/30 = High Fall Risk              TODAY'S TREATMENT:                                                                                                                              N/A during eval.   PATIENT EDUCATION: Education details: Clinical findings, POC, results of FGA, trialed a foot-up brace and also educated on an AFO (will have to try in a future session) Person educated: Patient Education method: Explanation and Demonstration Education comprehension: verbalized understanding  HOME EXERCISE PROGRAM: Will provide at next session.   GOALS: Goals reviewed with patient? Yes  SHORT TERM GOALS: Target date: 05/05/2023  Pt will be independent with initial HEP in order to build upon functional  gains made in therapy. Baseline: Goal status: INITIAL  2.  Pt will trial R foot up brace and R AFO to determine best option.  Baseline:  Goal status: INITIAL  3.  Pt will improve FGA to at least a 19/30 in order to demo decr fall risk Baseline: 15/30 Goal status: INITIAL  4.  Pt will improve gait speed with no AD to at least 2.8 ft/sec in order to demo improved community mobility.   Baseline: 2.57 ft/sec Goal status: INITIAL    LONG TERM GOALS: Target date: 06/02/2023  Pt will be independent with final HEP in order to build upon functional gains made in therapy. Baseline:  Goal status: INITIAL  2.  Pt will improve FGA to at least a 23/30 in order to demo decr fall risk Baseline:  Goal status: INITIAL  3.  Pt will improve gait speed with no AD to at least 3.1 ft/sec in order to demo improved community mobility.  Baseline:  Goal status: INITIAL  4.  Pt will perform stairs 12 stairs with reciprocal pattern without UE support with mod I in order to demo improvement with stairs.   Baseline: Alternating pattern, but does need railings at time for balance  Goal status: INITIAL  5.  Pt will be able to jog at least 230' with supervision in order for return to jogging.  Baseline:  Goal status: INITIAL    ASSESSMENT:  CLINICAL IMPRESSION: Patient is a 22 year old female referred to Neuro OPPT for MOGAD.   Pt's PMH is significant for: MOGAD, Anxiety. The following deficits were present during the exam: decr RLE strength, impaired balance, gait abnormalities, impaired sensation. Pt with decr R ankle DF foot clearance during gait. Trialed a R foot up brace for 115' with pt demonstrating improvements. Showed pt where to purchase and will further practice in session.  Based on FGA, pt is an incr risk for falls. Pt's gait speed with no AD, indicates a limited community ambulator. Pt would benefit from skilled PT to address these impairments and functional limitations to maximize functional  mobility independence and decr fall risk.    OBJECTIVE IMPAIRMENTS: Abnormal gait, decreased activity tolerance, decreased balance, difficulty walking, decreased ROM, decreased strength, impaired sensation, and pain.   ACTIVITY LIMITATIONS: stairs and locomotion level  PARTICIPATION LIMITATIONS:  jogging  PERSONAL FACTORS: Age, Time since onset of injury/illness/exacerbation, and 1-2 comorbidities: MOGAD, Anxiety  are also affecting patient's functional outcome.   REHAB POTENTIAL: Good  CLINICAL DECISION MAKING: Stable/uncomplicated  EVALUATION COMPLEXITY: Low  PLAN:  PT FREQUENCY: 1x/week  PT DURATION: 8 weeks  PLANNED INTERVENTIONS: Therapeutic exercises, Therapeutic activity, Neuromuscular re-education, Balance training, Gait training, Patient/Family education, Self Care, Stair training, Vestibular training, Orthotic/Fit training, DME instructions, Manual therapy, and Re-evaluation  PLAN FOR NEXT SESSION: Trial R foot up brace and AFO again. Initial HEP for ankle DF, hip ABD strength, RLE strength and balance (head turns, eyes closed, SLS).    Drake Leach, PT, DPT  04/07/2023, 9:09 AM

## 2023-04-07 ENCOUNTER — Ambulatory Visit: Payer: 59 | Attending: Neurology | Admitting: Physical Therapy

## 2023-04-07 ENCOUNTER — Encounter: Payer: Self-pay | Admitting: Physical Therapy

## 2023-04-07 DIAGNOSIS — M6281 Muscle weakness (generalized): Secondary | ICD-10-CM

## 2023-04-07 DIAGNOSIS — G3781 Myelin oligodendrocyte glycoprotein antibody disease: Secondary | ICD-10-CM | POA: Insufficient documentation

## 2023-04-07 DIAGNOSIS — R2689 Other abnormalities of gait and mobility: Secondary | ICD-10-CM

## 2023-04-07 DIAGNOSIS — M21371 Foot drop, right foot: Secondary | ICD-10-CM

## 2023-04-07 DIAGNOSIS — R2681 Unsteadiness on feet: Secondary | ICD-10-CM | POA: Insufficient documentation

## 2023-04-14 ENCOUNTER — Ambulatory Visit: Payer: 59 | Admitting: Physical Therapy

## 2023-04-14 ENCOUNTER — Telehealth: Payer: Self-pay | Admitting: Physical Therapy

## 2023-04-14 DIAGNOSIS — M21371 Foot drop, right foot: Secondary | ICD-10-CM

## 2023-04-14 DIAGNOSIS — R2689 Other abnormalities of gait and mobility: Secondary | ICD-10-CM | POA: Diagnosis not present

## 2023-04-14 DIAGNOSIS — R2681 Unsteadiness on feet: Secondary | ICD-10-CM | POA: Diagnosis not present

## 2023-04-14 DIAGNOSIS — G3781 Myelin oligodendrocyte glycoprotein antibody disease: Secondary | ICD-10-CM | POA: Diagnosis not present

## 2023-04-14 DIAGNOSIS — M6281 Muscle weakness (generalized): Secondary | ICD-10-CM | POA: Diagnosis not present

## 2023-04-14 NOTE — Addendum Note (Signed)
Addended by: Asa Lente on: 04/14/2023 05:05 PM   Modules accepted: Orders

## 2023-04-14 NOTE — Telephone Encounter (Signed)
Dr. Epimenio Foot,  Robin Mercer was evaluated by PT on 04/07/23.  The patient would benefit from an order for R AFO for foot drop.   If you agree, please place an order in [facility] workque in [facility] or fax the order to [phone].   Please also addend your most recent visit note (04/01/23) to state medical necessity for an AFO for insurance purposes.   Thank you, Ernestene Kiel, PT, DPT [facility] [address] [place], [place]  [address] Phone:  [phone] Fax:  [phone]

## 2023-04-14 NOTE — Therapy (Signed)
OUTPATIENT PHYSICAL THERAPY NEURO TREATMENT   Patient Name: Robin Mercer MRN: [id number] DOB:[birth year], 22 y.o., female Today's Date: 04/14/2023   PCP: Carin Hock, PA  REFERRING PROVIDER: Asa Lente, MD   END OF SESSION:  PT End of Session - 04/14/23 1010     Visit Number 2    Number of Visits 9    Date for PT Re-Evaluation 06/06/23    Authorization Type [employer] Medicare    PT Start Time [phone]   Therapist running late   PT Stop Time 1015    PT Time Calculation (min) 40 min    Equipment Utilized During Treatment Other (comment)   R foot up brace   Activity Tolerance Patient tolerated treatment well    Behavior During Therapy [facility] for tasks assessed/performed              Past Medical History:  Diagnosis Date   Seasonal allergies    No past surgical history on file. 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   Right foot drop 04/01/2023   High risk medication use 04/01/2023   Vitamin D deficiency 04/01/2023   Pyuria 04/13/2022   Marijuana use 04/11/2022   Multiple sclerosis (HCC) 04/11/2022    ONSET DATE: 04/01/2023 (date of referral)   REFERRING DIAG: G37.81 (ICD-10-CM) - Myelin oligodendrocyte glycoprotein antibody disorder (MOGAD) M21.371 (ICD-10-CM) - Right foot drop   THERAPY DIAG:  Foot drop, right  Muscle weakness (generalized)  Unsteadiness on feet  Rationale for Evaluation and Treatment: Rehabilitation  SUBJECTIVE:                                                                                                                                                                                             SUBJECTIVE STATEMENT: Pt presents to clinic wearing foot up brace on R shoe. Still getting used to it. No falls.   Pt accompanied by: self  PERTINENT HISTORY: MOGAD, Anxiety  Per Dr. Epimenio Foot: Currently, she has mild right leg weakness and mild reduced  gait. She is better than last year.  She could easily walk a mile and mostly keeps up with others. She has had no falls. She still has right leg numbness and tingling, also better since the last visit. Bladder function has improved back to normal.  Vision is fine.   On Actemra once a week since June 2023   PAIN:  Are you having pain? Yes: NPRS scale: 4/10 Pain location: RLE Pain description: Nerve pain Aggravating factors: Nothing Relieving factors: Nothing  PRECAUTIONS: None  WEIGHT BEARING RESTRICTIONS: No  FALLS: Has patient fallen in last 6 months? 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   PATIENT GOALS: Strengthen R leg, improve stairs, going back to running   OBJECTIVE:   DIAGNOSTIC FINDINGS: MRI of the brain 04/11/2022 showed multiple T2/FLAIR hyperintense foci in the periventricular, juxtacortical and deep white matter. None of the foci enhanced after contrast. 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  MRI of the lumbar spine 04/11/2022: There are T2 hyperintense foci adjacent to T10 and adjacent to T11-T12 (posterior). They do not enhance.  COGNITION: Overall cognitive status: Within functional limits for tasks assessed   SENSATION: Light touch: Impaired  and With RLE, pt with numbness/tingling in R thigh and decr light touch more distally.   COORDINATION: Heel to shin: WFL    POSTURE: No Significant postural limitations   LOWER EXTREMITY MMT:    MMT Right Eval Left Eval  Hip flexion 4 5  Hip extension    Hip abduction 3- 5  Hip adduction 5 5  Hip internal rotation    Hip external rotation    Knee flexion 4+ 5  Knee extension 4+ 4+  Ankle dorsiflexion 3- 5  Ankle  plantarflexion    Ankle inversion    Ankle eversion    (Blank rows = not tested)  All tested in sitting    TODAY'S TREATMENT:  Gait Training  Gait pattern: step through pattern, decreased arm swing- Right, decreased arm swing- Left, decreased hip/knee flexion- Right, and lateral hip instability Distance walked: >200' indoors, up/down steps and >300' outside on sidewalk/grass  Assistive device utilized:  R [place] WalkOn AFO Level of assistance: Modified independence and SBA Comments: Trialed use of R Ott[place]ock walkon AFO on various surfaces and pt reported feeling much more stable with AFO vs Foot-up brace. Pt did require SBA when ambulating on grass due to lateral instability but otherwise was mod I w/gait. Pt ascended/descended 4 6" steps x2 and noted increased step clearance while descending and stability on RLE while ascending.   Gait pattern: step through pattern, decreased arm swing- Right, decreased arm swing- Left, decreased stride length, decreased hip/knee flexion- Right, decreased ankle dorsiflexion- Right, lateral hip instability, and poor foot clearance- Right Distance walked: 115'  Assistive device utilized:  R [place] SpryStep AFO  Level of assistance: CGA Comments: Trialed use of R [place] SpryStep AFO to determine if pt prefers medial or lateral strut. Pt much more unstable with this brace, frequently tripping over RLE and compensating for R hip weakness w/L truncal lean.   Ther Ex Established initial HEP (see bolded below) for improved single leg stability, DF strength and quad/hip abductor strength. Pt performed well w/no instability    PATIENT EDUCATION: Education details: Plan to obtain AFO, initial HEP Person educated: Patient Education method: Explanation, Demonstration, and Handouts Education comprehension: verbalized understanding and returned demonstration  HOME EXERCISE PROGRAM: Access Code: [email or web address] URL: [email or web address] Date:  04/14/2023 Prepared by: Alethia Berthold Davell Beckstead  Exercises - Forward Step Down with Heel Tap and Rail Support  - 1 x daily - 7 x weekly - 3 sets - 10 reps - Side Step Down with Counter Support  - 1 x daily - 7 x weekly - 3 sets - 10 reps - Side Stepping with Resistance at [employer]  and Counter Support  - 1 x daily - 7 x weekly - 3-5 reps  GOALS: Goals reviewed with patient? Yes  SHORT TERM GOALS: Target date: 05/05/2023  Pt will be independent with initial HEP in order to build upon functional gains made in therapy. Baseline: Goal status: INITIAL  2.  Pt will trial R foot up brace and R AFO to determine best option.  Baseline: Pt prefers AFO (5/20) Goal status: MET  3.  Pt will improve FGA to at least a 19/30 in order to demo decr fall risk Baseline: 15/30 Goal status: INITIAL  4.  Pt will improve gait speed with no AD to at least 2.8 ft/sec in order to demo improved community mobility.   Baseline: 2.57 ft/sec Goal status: INITIAL    LONG TERM GOALS: Target date: 06/02/2023  Pt will be independent with final HEP in order to build upon functional gains made in therapy. Baseline:  Goal status: INITIAL  2.  Pt will improve FGA to at least a 23/30 in order to demo decr fall risk Baseline:  Goal status: INITIAL  3.  Pt will improve gait speed with no AD to at least 3.1 ft/sec in order to demo improved community mobility.  Baseline:  Goal status: INITIAL  4.  Pt will perform stairs 12 stairs with reciprocal pattern without UE support with mod I in order to demo improvement with stairs.   Baseline: Alternating pattern, but does need railings at time for balance  Goal status: INITIAL  5.  Pt will be able to jog at least 230' with supervision in order for return to jogging.  Baseline:  Goal status: INITIAL    ASSESSMENT:  CLINICAL IMPRESSION: Emphasis of skilled PT session on gait training w/various AFOs and establishing initial HEP for improved RLE strength. Pt has purchased  foot-up brace but prefers Ottobock AFO as this is more stable for her. Pt ambulated well w/AFO and is better able to manage brace in her shoe compared to foot-up brace, which required mod A from therapist to correctly don at beginning and end of session. Therapist to request AFO order today. Pt continues to be limited by R ankle DF and hip abductor weakness but performed eccentric heel taps and monster walks well w/no LOB. Continue POC.    OBJECTIVE IMPAIRMENTS: Abnormal gait, decreased activity tolerance, decreased balance, difficulty walking, decreased ROM, decreased strength, impaired sensation, and pain.   ACTIVITY LIMITATIONS: stairs and locomotion level  PARTICIPATION LIMITATIONS:  jogging  PERSONAL FACTORS: Age, Time since onset of injury/illness/exacerbation, and 1-2 comorbidities: MOGAD, Anxiety  are also affecting patient's functional outcome.   REHAB POTENTIAL: Good  CLINICAL DECISION MAKING: Stable/uncomplicated  EVALUATION COMPLEXITY: Low  PLAN:  PT FREQUENCY: 1x/week  PT DURATION: 8 weeks  PLANNED INTERVENTIONS: Therapeutic exercises, Therapeutic activity, Neuromuscular re-education, Balance training, Gait training, Patient/Family education, Self Care, Stair training, Vestibular training, Orthotic/Fit training, DME instructions, Manual therapy, and Re-evaluation  PLAN FOR NEXT SESSION: RDLs, TM training, seated march overs. Add to HEP for ankle DF, hip ABD strength, RLE strength and balance (head turns, eyes closed, SLS).    Jill Alexanders Bianca Raneri, PT, DPT  04/14/2023, 10:16 AM

## 2023-04-22 ENCOUNTER — Ambulatory Visit: Payer: 59 | Admitting: Physical Therapy

## 2023-04-22 DIAGNOSIS — R2681 Unsteadiness on feet: Secondary | ICD-10-CM | POA: Diagnosis not present

## 2023-04-22 DIAGNOSIS — G3781 Myelin oligodendrocyte glycoprotein antibody disease: Secondary | ICD-10-CM | POA: Diagnosis not present

## 2023-04-22 DIAGNOSIS — M6281 Muscle weakness (generalized): Secondary | ICD-10-CM

## 2023-04-22 DIAGNOSIS — M21371 Foot drop, right foot: Secondary | ICD-10-CM

## 2023-04-22 DIAGNOSIS — R2689 Other abnormalities of gait and mobility: Secondary | ICD-10-CM | POA: Diagnosis not present

## 2023-04-22 NOTE — Therapy (Signed)
OUTPATIENT PHYSICAL THERAPY NEURO TREATMENT   Patient Name: Robin Mercer MRN: [id number] DOB:Feb 06, 2001, 22 y.o., female Today's Date: 04/22/2023   PCP: Carin Hock, PA  REFERRING PROVIDER: Asa Lente, MD   END OF SESSION:  PT End of Session - 04/22/23 1357     Visit Number 3    Number of Visits 9    Date for PT Re-Evaluation 06/06/23    Authorization Type [employer]    PT Start Time 1355    PT Stop Time 1439    PT Time Calculation (min) 44 min    Equipment Utilized During Treatment Other (comment);Gait belt   R foot up brace   Activity Tolerance Patient tolerated treatment well    Behavior During Therapy WFL for tasks assessed/performed               Past Medical History:  Diagnosis Date   Seasonal allergies    No past surgical history on file. 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   Right foot drop 04/01/2023   High risk medication use 04/01/2023   Vitamin D deficiency 04/01/2023   Pyuria 04/13/2022   Marijuana use 04/11/2022   Multiple sclerosis (HCC) 04/11/2022    ONSET DATE: 04/01/2023 (date of referral)   REFERRING DIAG: G37.81 (ICD-10-CM) - Myelin oligodendrocyte glycoprotein antibody disorder (MOGAD) M21.371 (ICD-10-CM) - Right foot drop   THERAPY DIAG:  Foot drop, right  Unsteadiness on feet  Muscle weakness (generalized)  Rationale for Evaluation and Treatment: Rehabilitation  SUBJECTIVE:                                                                                                                                                                                             SUBJECTIVE STATEMENT: Pt presents to clinic wearing foot up brace on R shoe. Feels like she is getting stronger. Denies acute changes or falls.   Pt accompanied by: self  PERTINENT HISTORY: MOGAD, Anxiety  Per Dr. Epimenio Foot: Currently, she has mild right leg  weakness and mild reduced gait. She is better than last year.  She could easily walk a mile and mostly keeps up with others. She has had no falls. She still has right leg numbness and tingling, also better since the last visit. Bladder function has improved back to normal.  Vision is fine.   On Actemra once a week since June 2023   PAIN:  Are you having pain? Yes: NPRS scale: 2/10 Pain location: RLE Pain description: Nerve pain Aggravating factors: Nothing Relieving factors: Nothing  PRECAUTIONS: None  WEIGHT BEARING RESTRICTIONS:  No  FALLS: Has patient fallen in last 6 months? 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   PATIENT GOALS: Strengthen R leg, improve stairs, going back to running   OBJECTIVE:   DIAGNOSTIC FINDINGS: MRI of the brain 04/11/2022 showed multiple T2/FLAIR hyperintense foci in the periventricular, juxtacortical and deep white matter. None of the foci enhanced after contrast. 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  MRI of the lumbar spine 04/11/2022: There are T2 hyperintense foci adjacent to T10 and adjacent to T11-T12 (posterior). They do not enhance.  COGNITION: Overall cognitive status: Within functional limits for tasks assessed   SENSATION: Light touch: Impaired  and With RLE, pt with numbness/tingling in R thigh and decr light touch more distally.   COORDINATION: Heel to shin: WFL    POSTURE: No Significant postural limitations   LOWER EXTREMITY MMT:    MMT Right Eval Left Eval  Hip flexion 4 5  Hip extension    Hip abduction 3- 5  Hip adduction 5 5  Hip internal rotation    Hip external rotation    Knee flexion 4+ 5  Knee extension 4+ 4+  Ankle  dorsiflexion 3- 5  Ankle plantarflexion    Ankle inversion    Ankle eversion    (Blank rows = not tested)  All tested in sitting    TODAY'S TREATMENT:  Gait Training  The following treadmill training was completed for aerobic/neural priming, endurance, RLE step clearance and gait speed. Set incline at 2% to promote increased hip/knee flexion of RLE - Warmup: 5:00 up to 1.8 mph w/BUE support. Noted decreased eccentric control of RLE w/IC - HIIT: 3:00 30sec ON/OFF alternating 2.5 / 1.8 mph. Pt demonstrating increased circumduction of RLE and reliance on hip adductors to complete swing phase w/increased speed. Mod verbal cues to facilitate increased knee flexion and lead w/heel of foot and pt able to demonstrate on final round -Cool down: 2:00 at 1.8 mph w/single UE support.  -Using Borg scale, RPE of 11/20 following activity    NMR  On rebounder w/BUE support for improved BLE strength, LE coordination and ankle stability. CGA throughout:  Jumps in place. Noted decreased clearance w/RLE >LLE  In/outs, x10 w/double count. Decreased abduction of RLE > LLE but no difficulty noted  Scissor jumps, 2x8 per side. Pt demonstrated decreased hip abduction, resulting in catching of feet and difficulty w/coordination of task. Also noted inconsistent foot placement of RLE throughout  A-skips, x7 per side. Pt unable to achieve significant hip flexion/step clearance on RLE and noted excessive supination of R ankle throughout.  RDLs w/10# KB, x10  Progressed to staggered stance, x12 per side. Increased difficulty on R side Lateral cone drill over 25'  Seated march overs using 6" cone, x12 per side.  Supine banded marches w/red theraband, x10 per side     PATIENT EDUCATION: Education details: Plan to obtain AFO, initial HEP Person educated: Patient Education method: Explanation, Demonstration, and Handouts Education comprehension: verbalized understanding and returned demonstration  HOME EXERCISE  PROGRAM: Access Code: [email or web address] URL: [email or web address] Date: 04/14/2023 Prepared by: Alethia Berthold Karesa Maultsby  Exercises - Forward Step Down with Heel Tap and Rail Support  - 1 x daily - 7 x weekly - 3 sets - 10 reps -  Side Step Down with Counter Support  - 1 x daily - 7 x weekly - 3 sets - 10 reps - Side Stepping with Resistance at Thighs and Counter Support  - 1 x daily - 7 x weekly - 3-5 reps  GOALS: Goals reviewed with patient? Yes  SHORT TERM GOALS: Target date: 05/05/2023  Pt will be independent with initial HEP in order to build upon functional gains made in therapy. Baseline: Goal status: INITIAL  2.  Pt will trial R foot up brace and R AFO to determine best option.  Baseline: Pt prefers AFO (5/20) Goal status: MET  3.  Pt will improve FGA to at least a 19/30 in order to demo decr fall risk Baseline: 15/30 Goal status: INITIAL  4.  Pt will improve gait speed with no AD to at least 2.8 ft/sec in order to demo improved community mobility.   Baseline: 2.57 ft/sec Goal status: INITIAL    LONG TERM GOALS: Target date: 06/02/2023  Pt will be independent with final HEP in order to build upon functional gains made in therapy. Baseline:  Goal status: INITIAL  2.  Pt will improve FGA to at least a 23/30 in order to demo decr fall risk Baseline:  Goal status: INITIAL  3.  Pt will improve gait speed with no AD to at least 3.1 ft/sec in order to demo improved community mobility.  Baseline:  Goal status: INITIAL  4.  Pt will perform stairs 12 stairs with reciprocal pattern without UE support with mod I in order to demo improvement with stairs.   Baseline: Alternating pattern, but does need railings at time for balance  Goal status: INITIAL  5.  Pt will be able to jog at least 230' with supervision in order for return to jogging.  Baseline:  Goal status: INITIAL    ASSESSMENT:  CLINICAL IMPRESSION: Emphasis of skilled PT session on gait training w/various  AFOs and establishing initial HEP for improved RLE strength. Pt has purchased foot-up brace but prefers [place] AFO as this is more stable for her. Pt ambulated well w/AFO and is better able to manage brace in her shoe compared to foot-up brace, which required mod A from therapist to correctly don at beginning and end of session. Therapist to request AFO order today. Pt continues to be limited by R ankle DF and hip abductor weakness but performed eccentric heel taps and monster walks well w/no LOB. Continue POC.    OBJECTIVE IMPAIRMENTS: Abnormal gait, decreased activity tolerance, decreased balance, difficulty walking, decreased ROM, decreased strength, impaired sensation, and pain.   ACTIVITY LIMITATIONS: stairs and locomotion level  PARTICIPATION LIMITATIONS:  jogging  PERSONAL FACTORS: Age, Time since onset of injury/illness/exacerbation, and 1-2 comorbidities: MOGAD, Anxiety  are also affecting patient's functional outcome.   REHAB POTENTIAL: Good  CLINICAL DECISION MAKING: Stable/uncomplicated  EVALUATION COMPLEXITY: Low  PLAN:  PT FREQUENCY: 1x/week  PT DURATION: 8 weeks  PLANNED INTERVENTIONS: Therapeutic exercises, Therapeutic activity, Neuromuscular re-education, Balance training, Gait training, Patient/Family education, Self Care, Stair training, Vestibular training, Orthotic/Fit training, DME instructions, Manual therapy, and Re-evaluation  PLAN FOR NEXT SESSION: RDLs, TM training, seated march overs. Add to HEP for ankle DF, hip ABD strength, RLE strength and balance (head turns, eyes closed, SLS).    Jill Alexanders Tashica Provencio, PT, DPT  04/22/2023, 2:39 PM

## 2023-04-28 ENCOUNTER — Encounter: Payer: Self-pay | Admitting: Physical Therapy

## 2023-04-28 ENCOUNTER — Ambulatory Visit: Payer: 59 | Attending: Neurology | Admitting: Physical Therapy

## 2023-04-28 DIAGNOSIS — M21371 Foot drop, right foot: Secondary | ICD-10-CM | POA: Insufficient documentation

## 2023-04-28 DIAGNOSIS — R2681 Unsteadiness on feet: Secondary | ICD-10-CM | POA: Diagnosis not present

## 2023-04-28 DIAGNOSIS — R2689 Other abnormalities of gait and mobility: Secondary | ICD-10-CM | POA: Diagnosis not present

## 2023-04-28 DIAGNOSIS — M6281 Muscle weakness (generalized): Secondary | ICD-10-CM | POA: Insufficient documentation

## 2023-04-28 NOTE — Therapy (Signed)
OUTPATIENT PHYSICAL THERAPY NEURO TREATMENT   Patient Name: Robin Mercer MRN: [id number] DOB:Feb 23, 2001, 22 y.o., female Today's Date: 04/28/2023   PCP: Carin Hock, PA  REFERRING PROVIDER: Asa Lente, MD   END OF SESSION:  PT End of Session - 04/28/23 0929     Visit Number 4    Number of Visits 9    Date for PT Re-Evaluation 06/06/23    Authorization Type [employer] Medicare    PT Start Time [phone]    PT Stop Time 1013    PT Time Calculation (min) 45 min    Equipment Utilized During Treatment Other (comment);Gait belt   R foot up brace, R bioness   Activity Tolerance Patient tolerated treatment well    Behavior During Therapy WFL for tasks assessed/performed               Past Medical History:  Diagnosis Date   Seasonal allergies    History reviewed. No pertinent surgical history. 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   Right foot drop 04/01/2023   High risk medication use 04/01/2023   Vitamin D deficiency 04/01/2023   Pyuria 04/13/2022   Marijuana use 04/11/2022   Multiple sclerosis (HCC) 04/11/2022    ONSET DATE: 04/01/2023 (date of referral)   REFERRING DIAG: G37.81 (ICD-10-CM) - Myelin oligodendrocyte glycoprotein antibody disorder (MOGAD) M21.371 (ICD-10-CM) - Right foot drop   THERAPY DIAG:  Foot drop, right  Unsteadiness on feet  Muscle weakness (generalized)  Other abnormalities of gait and mobility  Rationale for Evaluation and Treatment: Rehabilitation  SUBJECTIVE:                                                                                                                                                                                             SUBJECTIVE STATEMENT: Feels like the exercises are getting better. When she walks, feels like her heels are hitting first vs her toes. Wearing the brace mainly for work and on the weekends can see  how she is doing. Has not contacted [facility] yet.   Pt accompanied by: self  PERTINENT HISTORY: MOGAD, Anxiety  Per Dr. Epimenio Foot: Currently, she has mild right leg weakness and mild reduced gait. She is better than last year.  She could easily walk a mile and mostly keeps up with others. She has had no falls. She still has right leg numbness and tingling, also better since the last visit. Bladder function has improved back to normal.  Vision is fine.   On Actemra once a week since June 2023   PAIN:  Are you having pain? Yes: NPRS scale: 2/10 Pain location: RLE Pain description: Nerve pain Aggravating factors: Nothing Relieving factors: Nothing  PRECAUTIONS: None  WEIGHT BEARING RESTRICTIONS: No  FALLS: Has patient fallen in last 6 months? 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   PATIENT GOALS: Strengthen R leg, improve stairs, going back to running   OBJECTIVE:   DIAGNOSTIC FINDINGS: MRI of the brain 04/11/2022 showed multiple T2/FLAIR hyperintense foci in the periventricular, juxtacortical and deep white matter. None of the foci enhanced after contrast. 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  MRI of the lumbar spine 04/11/2022: There are T2 hyperintense foci adjacent to T10 and adjacent to T11-T12 (posterior). They do not enhance.  COGNITION: Overall cognitive status: Within functional limits for tasks assessed   SENSATION: Light touch: Impaired  and With RLE, pt with numbness/tingling in R thigh and decr light touch more distally.   COORDINATION: Heel to shin: WFL    POSTURE: No Significant postural limitations   LOWER EXTREMITY MMT:    MMT Right Eval Left Eval  Hip flexion 4 5  Hip  extension    Hip abduction 3- 5  Hip adduction 5 5  Hip internal rotation    Hip external rotation    Knee flexion 4+ 5  Knee extension 4+ 4+  Ankle dorsiflexion 3- 5  Ankle plantarflexion    Ankle inversion    Ankle eversion    (Blank rows = not tested)  All tested in sitting    TODAY'S TREATMENT:  NMR  Set up Bioness for Electrical Stimulation to R anterior tib with quick fit electrodes on tablet 1 for strengthening, NMR, gait mechanics    With use of Bioness to R anterior tib: 345' x 1 forward gait for improved R heel strike and ankle DF, pt needing one episode of CGA for balance, otherwise provided supervision. Pt with good response to Bioness and demonstrated improved heel strike.  115' x 1 with gait with head turns and 115' x 1 with gait with head nods, pt more unsteady with head turns and slower gait speed, needing CGA at times for balance   On activity mode, performed activity during on times and rested during off times for improved ankle DF strengthening and balance/SLS stability: Forward heel taps with RLE from 6" step; 6 reps of 10 seconds on, 8 seconds off  Lateral heel taps with RLE from 6" step; 6 reps of 10 seconds on, 8 seconds off  Heel walking at countertop down and back x4 reps during on times of 10 seconds  Forward marching with blue theraband around ankles > thighs for hip flexor strengthening, SLS stability; performed down and back x4 reps, cues for slowed pace for SLS - added to HEP for pt to do at home  Without Bioness, on air ex in corner: EC: Feet narrow BOS x30 seconds, with feet together 2 x 30 seconds, pt with incr postural sway, cues to keep a soft bend in RLE  EO: feet together head turns x10 reps, head nods x10 reps, pt with more postural sway with head nods    Access Code: [email or web address] URL: [email or web address] Date: 04/28/2023 Prepared by: Sherlie Ban  Exercises - Forward Step Down with Heel Tap and Rail Support  -  1 x daily -  7 x weekly - 3 sets - 10 reps - Side Step Down with Counter Support  - 1 x daily - 7 x weekly - 3 sets - 10 reps - Side Stepping with Resistance at Thighs and Counter Support  - 1 x daily - 7 x weekly - 3-5 reps  Added bolded below to HEP  - Marching with Resistance  - 1 x daily - 7 x weekly - 3 sets  - with use of blue tband around ankles  - Romberg Stance Eyes Closed on Foam Pad  - 1 x daily - 7 x weekly - 3 sets - 30 hold - Walking with Head Rotation  - 1 x daily - 7 x weekly - 3 sets, next to wall or countertop for balance as needed.    PATIENT EDUCATION: Education details: Additions to HEP, purpose of Bioness  Person educated: Patient Education method: Explanation, Demonstration, and Verbal cues Education comprehension: verbalized understanding and returned demonstration  HOME EXERCISE PROGRAM: Access Code: [email or web address] URL: [email or web address] Date: 04/28/2023 Prepared by: Sherlie Ban  Exercises - Forward Step Down with Heel Tap and Rail Support  - 1 x daily - 7 x weekly - 3 sets - 10 reps - Side Step Down with Counter Support  - 1 x daily - 7 x weekly - 3 sets - 10 reps - Side Stepping with Resistance at Thighs and Counter Support  - 1 x daily - 7 x weekly - 3-5 reps - Marching with Resistance  - 1 x daily - 7 x weekly - 3 sets  - Romberg Stance Eyes Closed on Foam Pad  - 1 x daily - 7 x weekly - 3 sets - 30 hold - Walking with Head Rotation  - 1 x daily - 7 x weekly - 3 sets  GOALS: Goals reviewed with patient? Yes  SHORT TERM GOALS: Target date: 05/05/2023  Pt will be independent with initial HEP in order to build upon functional gains made in therapy. Baseline: Goal status: INITIAL  2.  Pt will trial R foot up brace and R AFO to determine best option.  Baseline: Pt prefers AFO (5/20) Goal status: MET  3.  Pt will improve FGA to at least a 19/30 in order to demo decr fall risk Baseline: 15/30 Goal status: INITIAL  4.  Pt will improve gait speed  with no AD to at least 2.8 ft/sec in order to demo improved community mobility.   Baseline: 2.57 ft/sec Goal status: INITIAL    LONG TERM GOALS: Target date: 06/02/2023  Pt will be independent with final HEP in order to build upon functional gains made in therapy. Baseline:  Goal status: INITIAL  2.  Pt will improve FGA to at least a 23/30 in order to demo decr fall risk Baseline:  Goal status: INITIAL  3.  Pt will improve gait speed with no AD to at least 3.1 ft/sec in order to demo improved community mobility.  Baseline:  Goal status: INITIAL  4.  Pt will perform stairs 12 stairs with reciprocal pattern without UE support with mod I in order to demo improvement with stairs.   Baseline: Alternating pattern, but does need railings at time for balance  Goal status: INITIAL  5.  Pt will be able to jog at least 230' with supervision in order for return to jogging.  Baseline:  Goal status: INITIAL    ASSESSMENT:  CLINICAL IMPRESSION: Educated pt on purpose on Bioness for R  anterior tib strengthening, with pt in agreement to try. Pt demonstrating a good response to electrical stimulation and had improved R heel strike during gait and strengthening/balance exercises. Pt challenged by gait with head turns and needing CGA at times for balance and pt slowing down her gait speed. Without use of Bioness, pt with incr postural sway with balance with vision removed on compliant surfaces, added this to HEP to work on vestibular system for balance. RE-cert sent to Dr. Epimenio Foot to cover electrical stimulation in POC (not originally included) in order to continue working on strengthening/NMR to RLE to improve gait mechanics and balance. Will continue to progress towards LTGs.   OBJECTIVE IMPAIRMENTS: Abnormal gait, decreased activity tolerance, decreased balance, difficulty walking, decreased ROM, decreased strength, impaired sensation, and pain.   ACTIVITY LIMITATIONS: stairs and locomotion  level  PARTICIPATION LIMITATIONS:  jogging  PERSONAL FACTORS: Age, Time since onset of injury/illness/exacerbation, and 1-2 comorbidities: MOGAD, Anxiety  are also affecting patient's functional outcome.   REHAB POTENTIAL: Good  CLINICAL DECISION MAKING: Stable/uncomplicated  EVALUATION COMPLEXITY: Low  PLAN:  PT FREQUENCY: 1x/week  PT DURATION: 8 weeks  PLANNED INTERVENTIONS: Therapeutic exercises, Therapeutic activity, Neuromuscular re-education, Balance training, Gait training, Patient/Family education, Self Care, Stair training, Vestibular training, Orthotic/Fit training, DME instructions, Electrical stimulation, Manual therapy, and Re-evaluation  PLAN FOR NEXT SESSION: check STGs, work on going down inclines. Continue Bioness for gait and exercises? (Pt is set up on tablet one). any update from [facility]? RDLs, TM training.   hip ABD strength, RLE strength and balance (head turns, eyes closed, SLS). Single leg raises w/slow eccentric   Drake Leach, PT, DPT  04/28/2023, 12:03 PM

## 2023-05-05 ENCOUNTER — Ambulatory Visit: Payer: 59 | Admitting: Physical Therapy

## 2023-05-05 DIAGNOSIS — R2681 Unsteadiness on feet: Secondary | ICD-10-CM | POA: Diagnosis not present

## 2023-05-05 DIAGNOSIS — M6281 Muscle weakness (generalized): Secondary | ICD-10-CM

## 2023-05-05 DIAGNOSIS — M21371 Foot drop, right foot: Secondary | ICD-10-CM | POA: Diagnosis not present

## 2023-05-05 DIAGNOSIS — R2689 Other abnormalities of gait and mobility: Secondary | ICD-10-CM

## 2023-05-05 NOTE — Therapy (Signed)
OUTPATIENT PHYSICAL THERAPY NEURO TREATMENT   Patient Name: Robin Mercer MRN: [id number] DOB:09/15/2001, 22 y.o., female Today's Date: 05/05/2023   PCP: Carin Hock, PA  REFERRING PROVIDER: Asa Lente, MD   END OF SESSION:  PT End of Session - 05/05/23 0936     Visit Number 5    Number of Visits 9    Date for PT Re-Evaluation 06/06/23    Authorization Type [employer]    PT Start Time 0935   Pt in restroom   PT Stop Time 1018    PT Time Calculation (min) 43 min    Equipment Utilized During Treatment Gait belt;Other (comment)   R foot up brace   Activity Tolerance Patient tolerated treatment well    Behavior During Therapy WFL for tasks assessed/performed                Past Medical History:  Diagnosis Date   Seasonal allergies    No past surgical history on file. 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   Right foot drop 04/01/2023   High risk medication use 04/01/2023   Vitamin D deficiency 04/01/2023   Pyuria 04/13/2022   Marijuana use 04/11/2022   Multiple sclerosis (HCC) 04/11/2022    ONSET DATE: 04/01/2023 (date of referral)   REFERRING DIAG: G37.81 (ICD-10-CM) - Myelin oligodendrocyte glycoprotein antibody disorder (MOGAD) M21.371 (ICD-10-CM) - Right foot drop   THERAPY DIAG:  Foot drop, right  Unsteadiness on feet  Muscle weakness (generalized)  Other abnormalities of gait and mobility  Rationale for Evaluation and Treatment: Rehabilitation  SUBJECTIVE:                                                                                                                                                                                             SUBJECTIVE STATEMENT: Feels like she is getting stronger. Is tired today but is overall doing well. Head turns are most challenging exercise of her HEP   Pt accompanied by: self  PERTINENT HISTORY:  MOGAD, Anxiety  Per Dr. Epimenio Foot: Currently, she has mild right leg weakness and mild reduced gait. She is better than last year.  She could easily walk a mile and mostly keeps up with others. She has had no falls. She still has right leg numbness and tingling, also better since the last visit. Bladder function has improved back to normal.  Vision is fine.   On Actemra once a week since June 2023   PAIN:  Are you having pain? No  PRECAUTIONS: None  WEIGHT BEARING RESTRICTIONS: No  FALLS:  Has patient fallen in last 6 months? 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   PATIENT GOALS: Strengthen R leg, improve stairs, going back to running   OBJECTIVE:   DIAGNOSTIC FINDINGS: MRI of the brain 04/11/2022 showed multiple T2/FLAIR hyperintense foci in the periventricular, juxtacortical and deep white matter. None of the foci enhanced after contrast. 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  MRI of the lumbar spine 04/11/2022: There are T2 hyperintense foci adjacent to T10 and adjacent to T11-T12 (posterior). They do not enhance.  COGNITION: Overall cognitive status: Within functional limits for tasks assessed   SENSATION: Light touch: Impaired  and With RLE, pt with numbness/tingling in R thigh and decr light touch more distally.   COORDINATION: Heel to shin: WFL    POSTURE: No Significant postural limitations   LOWER EXTREMITY MMT:    MMT Right Eval Left Eval  Hip flexion 4 5  Hip extension    Hip abduction 3- 5  Hip adduction 5 5  Hip internal rotation    Hip external rotation    Knee flexion 4+ 5  Knee extension 4+ 4+  Ankle dorsiflexion 3- 5  Ankle plantarflexion    Ankle inversion    Ankle  eversion    (Blank rows = not tested)  All tested in sitting    TODAY'S TREATMENT:  Ther Act  STG Assessment    [facility] PT Assessment - 05/05/23 0940       Ambulation/Gait   Gait velocity 32.8' over 10.9s = 3.01 ft/s   w/foot-up brace     Functional Gait  Assessment   Gait assessed  Yes    Gait Level Surface Walks 20 ft, slow speed, abnormal gait pattern, evidence for imbalance or deviates 10-15 in outside of the 12 in walkway width. Requires more than 7 sec to ambulate 20 ft.   7.81s   Change in Gait Speed Able to change speed, demonstrates mild gait deviations, deviates 6-10 in outside of the 12 in walkway width, or no gait deviations, unable to achieve a major change in velocity, or uses a change in velocity, or uses an assistive device.    Gait with Horizontal Head Turns Performs head turns smoothly with slight change in gait velocity (eg, minor disruption to smooth gait path), deviates 6-10 in outside 12 in walkway width, or uses an assistive device.    Gait with Vertical Head Turns Performs head turns with no change in gait. Deviates no more than 6 in outside 12 in walkway width.    Gait and Pivot Turn Pivot turns safely within 3 sec and stops quickly with no loss of balance.    Step Over Obstacle Is able to step over 2 stacked shoe boxes taped together (9 in total height) without changing gait speed. No evidence of imbalance.    Gait with Narrow Base of Support Ambulates 4-7 steps.    Gait with Eyes Closed Walks 20 ft, slow speed, abnormal gait pattern, evidence for imbalance, deviates 10-15 in outside 12 in walkway width. Requires more than 9 sec to ambulate 20 ft.   17.72s   Ambulating Backwards Walks 20 ft, slow speed, abnormal gait pattern, evidence for imbalance, deviates 10-15 in outside 12 in walkway width.   19.15s  Steps Alternating feet, must use rail.    Total Score 19    FGA comment: Medium fall risk             NMR The following were performed at the ballet bar  w/UE support for improved speed, weightbearing tolerance on forefeet and LE coordination to prime for jogging. CGA throughout: Modified stance jacks, x10 reps. This is easy for pt Alternating single leg hops, x8 per side. This more challenging and pt had difficulty coordinating hip flexion of RLE w/increased speed  Alt 6" cone taps w/emphasis on speed, x15 reps per side. Pt knocking cone over w/RLE occasionally and was unable to perform fluidly.  4 Blaze pods on random reach setting w/4s timeout for improved agility, coordination and speed. Pt to start about 2 feet from pods, shuffle forward and reach down to tap pod and quickly back peddle back to start position w/4s timeout on pods. Performed on 2 minute intervals with 3 minute rest periods.  Pt requires SBA guarding. Round 1:  pods placed in semicircle from 6-3 o'clock setup.  24 hits. RPE of 6/10  Round 2: same setup as round 1 w/2 pods elevated on 2" step and one on 4" step, cued to tap w/BLEs this round.  18 hits. Notable errors/deficits: increased difficulty w/retro gait and speed. Pt more unstable on round 2 initially but w/practice was able to increase speed w/increased stability  Hamstring walking on stool, x115' around gym, for improved posterior chain strength. Min cues to maintain forward direction throughout, as pt turning to R and performing sideways Added single leg calf raise and standing hip extensions to HEP (see bolded below) to work on posterior chain strength and stability on R forefoot to prime for jogging. Pt inquiring about whether she will be able to jog by visit 8. Informed pt that therapist unable to predict this, but will need more strengthening/stability of R ankle in order to accept weight on PF position of foot and push off to jog. Informed pt we will try squat jumps and skips next session. Pt verbalized understanding.    PATIENT EDUCATION: Education details: Additions to HEP, plan for next session  Person educated:  Patient Education method: Explanation, Demonstration, and Verbal cues Education comprehension: verbalized understanding and returned demonstration  HOME EXERCISE PROGRAM: Access Code: [email or web address] URL: [email or web address] Date: 04/28/2023 Prepared by: Sherlie Ban  Exercises - Forward Step Down with Heel Tap and Rail Support  - 1 x daily - 7 x weekly - 3 sets - 10 reps - Side Step Down with Counter Support  - 1 x daily - 7 x weekly - 3 sets - 10 reps - Side Stepping with Resistance at Thighs and Counter Support  - 1 x daily - 7 x weekly - 3-5 reps - Marching with Resistance  - 1 x daily - 7 x weekly - 3 sets  - Romberg Stance Eyes Closed on Foam Pad  - 1 x daily - 7 x weekly - 3 sets - 30 hold - Walking with Head Rotation  - 1 x daily - 7 x weekly - 3 sets - Single Leg Heel Raise with Unilateral Counter Support  - 1 x daily - 7 x weekly - 3 sets - 10 reps - Standing Hip Extension with Chair  - 1 x daily - 7 x weekly - 3 sets - 10 reps  GOALS: Goals reviewed with patient? Yes  SHORT TERM GOALS: Target date: 05/05/2023  Pt will be independent with initial  HEP in order to build upon functional gains made in therapy. Baseline: Goal status: MET  2.  Pt will trial R foot up brace and R AFO to determine best option.  Baseline: Pt prefers AFO (5/20) Goal status: MET  3.  Pt will improve FGA to at least a 19/30 in order to demo decr fall risk Baseline: 15/30; 19/30 (6/10) Goal status: MET  4.  Pt will improve gait speed with no AD to at least 2.8 ft/sec in order to demo improved community mobility.   Baseline: 2.57 ft/sec; 3.01 ft/s  Goal status: MET    LONG TERM GOALS: Target date: 06/02/2023  Pt will be independent with final HEP in order to build upon functional gains made in therapy. Baseline:  Goal status: INITIAL  2.  Pt will improve FGA to at least a 23/30 in order to demo decr fall risk Baseline: 19/30 (6/10) Goal status: INITIAL  3.  Pt will improve  gait speed with no AD to at least 3.4 ft/sec in order to demo improved community mobility.  Baseline: 3.01 ft/s (6/10) Goal status: REVISED  4.  Pt will perform stairs 12 stairs with reciprocal pattern without UE support with mod I in order to demo improvement with stairs.   Baseline: Alternating pattern, but does need railings at time for balance  Goal status: INITIAL  5.  Pt will be able to jog at least 230' with supervision in order for return to jogging.  Baseline:  Goal status: INITIAL    ASSESSMENT:  CLINICAL IMPRESSION: Emphasis of skilled PT session on STG assessment, agility, LE coordination and progression towards jogging. Pt has met all 4 STGs, improving her gait speed to almost LTG level and achieving a 19/30 on FGA, indicative of medium fall risk. Pt continues to be most challenged by gait w/head turns, EC, tandem and retro stepping, requiring CGA to perform safely. Pt continues to have a goal of jogging, but has decreased stability in PF position on R foot. Worked on Advice worker for remainder of session to improve quick weight shifting, direction changes and weightbearing tolerance on R forefoot to prime for jogging. Pt very challenged by this but overall performed well w/no major LOB. Pt requesting to continue working on this, so will address next session. Continue POC.   OBJECTIVE IMPAIRMENTS: Abnormal gait, decreased activity tolerance, decreased balance, difficulty walking, decreased ROM, decreased strength, impaired sensation, and pain.   ACTIVITY LIMITATIONS: stairs and locomotion level  PARTICIPATION LIMITATIONS:  jogging  PERSONAL FACTORS: Age, Time since onset of injury/illness/exacerbation, and 1-2 comorbidities: MOGAD, Anxiety  are also affecting patient's functional outcome.   REHAB POTENTIAL: Good  CLINICAL DECISION MAKING: Stable/uncomplicated  EVALUATION COMPLEXITY: Low  PLAN:  PT FREQUENCY: 1x/week  PT DURATION: 8 weeks  PLANNED INTERVENTIONS:  Therapeutic exercises, Therapeutic activity, Neuromuscular re-education, Balance training, Gait training, Patient/Family education, Self Care, Stair training, Vestibular training, Orthotic/Fit training, DME instructions, Electrical stimulation, Manual therapy, and Re-evaluation  PLAN FOR NEXT SESSION: squat jumps and A-skips, RDLS, stability on forefoot position. work on going down inclines. Continue Bioness for gait and exercises? (Pt is set up on tablet one). any update from [facility]? RDLs, TM training.   hip ABD strength, RLE strength and balance (head turns, eyes closed, SLS).    Jill Alexanders Jessina Marse, PT, DPT  05/05/2023, 10:19 AM

## 2023-05-09 ENCOUNTER — Encounter: Payer: Self-pay | Admitting: Neurology

## 2023-05-09 DIAGNOSIS — G3781 Myelin oligodendrocyte glycoprotein antibody disease: Secondary | ICD-10-CM

## 2023-05-12 ENCOUNTER — Ambulatory Visit: Payer: 59 | Admitting: Physical Therapy

## 2023-05-12 ENCOUNTER — Encounter: Payer: Self-pay | Admitting: Physical Therapy

## 2023-05-12 DIAGNOSIS — R2681 Unsteadiness on feet: Secondary | ICD-10-CM

## 2023-05-12 DIAGNOSIS — M21371 Foot drop, right foot: Secondary | ICD-10-CM

## 2023-05-12 DIAGNOSIS — R2689 Other abnormalities of gait and mobility: Secondary | ICD-10-CM | POA: Diagnosis not present

## 2023-05-12 DIAGNOSIS — M6281 Muscle weakness (generalized): Secondary | ICD-10-CM | POA: Diagnosis not present

## 2023-05-12 MED ORDER — ACTEMRA ACTPEN 162 MG/0.9ML ~~LOC~~ SOAJ: 162.0000 mg | SUBCUTANEOUS | 3 refills | Status: DC

## 2023-05-12 NOTE — Telephone Encounter (Signed)
Pt filling w/ [facility] at [phone]. E-scribed rx refill to pharmacy.   Actemra SQ autoinjector, inject 162mg /0.53ml once a week. 90 days supply w/ 3 refills. Dx codes: G37.8. 4 pens per month or 12 per 90 days.

## 2023-05-12 NOTE — Therapy (Signed)
OUTPATIENT PHYSICAL THERAPY NEURO TREATMENT   Patient Name: Robin Mercer MRN: [id number] DOB:[birth year], 22 y.o., female Today's Date: 05/12/2023   PCP: Carin Hock, PA  REFERRING PROVIDER: Asa Lente, MD   END OF SESSION:  PT End of Session - 05/12/23 0934     Visit Number 6    Number of Visits 9    Date for PT Re-Evaluation 06/06/23    Authorization Type [employer] Medicare    PT Start Time [phone]    PT Stop Time 1012    PT Time Calculation (min) 39 min    Equipment Utilized During Treatment Gait belt;Other (comment)   R foot up brace   Activity Tolerance Patient tolerated treatment well    Behavior During Therapy WFL for tasks assessed/performed                Past Medical History:  Diagnosis Date   Seasonal allergies    History reviewed. No pertinent surgical history. 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   Right foot drop 04/01/2023   High risk medication use 04/01/2023   Vitamin D deficiency 04/01/2023   Pyuria 04/13/2022   Marijuana use 04/11/2022   Multiple sclerosis (HCC) 04/11/2022    ONSET DATE: 04/01/2023 (date of referral)   REFERRING DIAG: G37.81 (ICD-10-CM) - Myelin oligodendrocyte glycoprotein antibody disorder (MOGAD) M21.371 (ICD-10-CM) - Right foot drop   THERAPY DIAG:  Foot drop, right  Unsteadiness on feet  Muscle weakness (generalized)  Other abnormalities of gait and mobility  Rationale for Evaluation and Treatment: Rehabilitation  SUBJECTIVE:                                                                                                                                                                                             SUBJECTIVE STATEMENT: Reports today is a worse day. Just woke up. Right leg is feeling a little more stiff. Wants to keep working on getting more weight through her R forefeet. Notices R hip is still  dropping when doing stairs   Pt accompanied by: self  PERTINENT HISTORY: MOGAD, Anxiety  Per Dr. Epimenio Foot: Currently, she has mild right leg weakness and mild reduced gait. She is better than last year.  She could easily walk a mile and mostly keeps up with others. She has had no falls. She still has right leg numbness and tingling, also better since the last visit. Bladder function has improved back to normal.  Vision is fine.   On Actemra once a week since June 2023   PAIN:  Are you having pain?  No  PRECAUTIONS: None  WEIGHT BEARING RESTRICTIONS: No  FALLS: Has patient fallen in last 6 months? 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   PATIENT GOALS: Strengthen R leg, improve stairs, going back to running   OBJECTIVE:   DIAGNOSTIC FINDINGS: MRI of the brain 04/11/2022 showed multiple T2/FLAIR hyperintense foci in the periventricular, juxtacortical and deep white matter. None of the foci enhanced after contrast. 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  MRI of the lumbar spine 04/11/2022: There are T2 hyperintense foci adjacent to T10 and adjacent to T11-T12 (posterior). They do not enhance.  COGNITION: Overall cognitive status: Within functional limits for tasks assessed   SENSATION: Light touch: Impaired  and With RLE, pt with numbness/tingling in R thigh and decr light touch more distally.   COORDINATION: Heel to shin: WFL    POSTURE: No Significant postural limitations   LOWER EXTREMITY MMT:    MMT Right Eval Left Eval  Hip flexion 4 5  Hip extension    Hip abduction 3- 5  Hip adduction 5 5  Hip internal rotation    Hip external rotation    Knee flexion 4+ 5  Knee extension 4+ 4+   Ankle dorsiflexion 3- 5  Ankle plantarflexion    Ankle inversion    Ankle eversion    (Blank rows = not tested)  All tested in sitting    TODAY'S TREATMENT:   Therapeutic Exercise: Elliptical for a total of 6 minutes: 4 minutes forwards and 2 minutes backwards at gear 1.0. Pt reporting RPE as a 7/10 and needing a seated rest break afterwards due to fatigue. RLE tremors noted during the last minute  Hamstring stretch to RLE 2 x 30 seconds Sidelying hip ABD 2 x 10 reps with RLE, with verbal/tactile cues for proper alignment and technique, towards end of 10 reps, pt compensating with hip flexion due to fatigue.  Quadruped fire hydrants x10 reps each side, tactile cues for proper alignment when performing on R side, pt with decr ROM on this side due to weakness  Sidelying RLE clamshells 2 x 10 reps with red t-band, added to HEP  Piriformis stretch with RLE after hip ABD strengthening 2 x 30 seconds Toe walking 4 x 20' for improved balance/stability in plantarflexed position, esp on R side, pt initially needing CGA for balance, but did improve with incr reps. Noted pt to have some toe in with LLE Mini squat and heel raise x10 reps without UE support, pt with good PF ROM with RLE  With BUE support: mini squat and jumping for coordination, weight bearing on forefoot and to prepare for jogging, pt holding onto balance bar, cued for little squat and pushing right up into extension (vs. Coming forward into incr knee flexion and then jumping up). Pt improved with incr reps    PATIENT EDUCATION: Education details: Addition of clamshells to HEP for R hip ABD strengthening with red t-band, areas to keep working on in therapy  Person educated: Patient Education method: Programmer, multimedia, Facilities manager, Verbal cues, and Handouts Education comprehension: verbalized understanding and returned demonstration  HOME EXERCISE PROGRAM: Access Code: [email or web address] URL: [email or web address] Date:  05/12/2023 Prepared by: Sherlie Ban  Exercises - Forward Step Down with Heel Tap and Rail Support  -  1 x daily - 7 x weekly - 3 sets - 10 reps - Side Step Down with Counter Support  - 1 x daily - 7 x weekly - 3 sets - 10 reps - Side Stepping with Resistance at Thighs and Counter Support  - 1 x daily - 7 x weekly - 3-5 reps - Marching with Resistance  - 1 x daily - 7 x weekly - 3 sets - Romberg Stance Eyes Closed on Foam Pad  - 1 x daily - 7 x weekly - 3 sets - 30 hold - Walking with Head Rotation  - 1 x daily - 7 x weekly - 3 sets - Single Leg Heel Raise with Unilateral Counter Support  - 1 x daily - 7 x weekly - 3 sets - 10 reps - Standing Hip Extension with Chair  - 1 x daily - 7 x weekly - 3 sets - 10 reps - Clamshell with Resistance  - 1 x daily - 7 x weekly - 2 sets - 10 reps  GOALS: Goals reviewed with patient? Yes  SHORT TERM GOALS: Target date: 05/05/2023  Pt will be independent with initial HEP in order to build upon functional gains made in therapy. Baseline: Goal status: MET  2.  Pt will trial R foot up brace and R AFO to determine best option.  Baseline: Pt prefers AFO (5/20) Goal status: MET  3.  Pt will improve FGA to at least a 19/30 in order to demo decr fall risk Baseline: 15/30; 19/30 (6/10) Goal status: MET  4.  Pt will improve gait speed with no AD to at least 2.8 ft/sec in order to demo improved community mobility.   Baseline: 2.57 ft/sec; 3.01 ft/s  Goal status: MET    LONG TERM GOALS: Target date: 06/02/2023  Pt will be independent with final HEP in order to build upon functional gains made in therapy. Baseline:  Goal status: INITIAL  2.  Pt will improve FGA to at least a 23/30 in order to demo decr fall risk Baseline: 19/30 (6/10) Goal status: INITIAL  3.  Pt will improve gait speed with no AD to at least 3.4 ft/sec in order to demo improved community mobility.  Baseline: 3.01 ft/s (6/10) Goal status: REVISED  4.  Pt will perform stairs 12  stairs with reciprocal pattern without UE support with mod I in order to demo improvement with stairs.   Baseline: Alternating pattern, but does need railings at time for balance  Goal status: INITIAL  5.  Pt will be able to jog at least 230' with supervision in order for return to jogging.  Baseline:  Goal status: INITIAL    ASSESSMENT:  CLINICAL IMPRESSION: Today's skilled session focused on RLE strengthening (esp hip ABD), weight bearing onto toes, and progressions towards jogging. Pt continues with RLE hip ABD weakness, so worked on exercises to address this with tactile/verbal cues from therapist for proper technique/alignment to prevent compensations from other muscle groups. Added sidelying clamshells to pt's HEP to address this. Practiced mini squat jumps today for PF and return to jogging, pt needing to use BUE support to perform. At beginning of session, pt reporting incr stiffness/pain in RLE, but reports it had improved at end of session. Will continue to progress towards LTGs.   OBJECTIVE IMPAIRMENTS: Abnormal gait, decreased activity tolerance, decreased balance, difficulty walking, decreased ROM, decreased strength, impaired sensation, and pain.   ACTIVITY LIMITATIONS: stairs and locomotion level  PARTICIPATION LIMITATIONS:  jogging  PERSONAL FACTORS:  Age, Time since onset of injury/illness/exacerbation, and 1-2 comorbidities: MOGAD, Anxiety  are also affecting patient's functional outcome.   REHAB POTENTIAL: Good  CLINICAL DECISION MAKING: Stable/uncomplicated  EVALUATION COMPLEXITY: Low  PLAN:  PT FREQUENCY: 1x/week  PT DURATION: 8 weeks  PLANNED INTERVENTIONS: Therapeutic exercises, Therapeutic activity, Neuromuscular re-education, Balance training, Gait training, Patient/Family education, Self Care, Stair training, Vestibular training, Orthotic/Fit training, DME instructions, Electrical stimulation, Manual therapy, and Re-evaluation  PLAN FOR NEXT SESSION:  squat jumps and A-skips, RDLS, stability on forefoot position. work on going down inclines. Continue Bioness for gait and exercises? (Pt is set up on tablet one). any update from [facility]? RDLs, TM training.   hip ABD strength, RLE strength and balance (head turns, eyes closed, SLS).   Add more appts??    Drake Leach, PT, DPT  05/12/2023, 10:25 AM

## 2023-05-19 ENCOUNTER — Ambulatory Visit: Payer: 59 | Admitting: Physical Therapy

## 2023-05-19 ENCOUNTER — Encounter: Payer: Self-pay | Admitting: Physical Therapy

## 2023-05-19 DIAGNOSIS — M21371 Foot drop, right foot: Secondary | ICD-10-CM

## 2023-05-19 DIAGNOSIS — M6281 Muscle weakness (generalized): Secondary | ICD-10-CM

## 2023-05-19 DIAGNOSIS — R2689 Other abnormalities of gait and mobility: Secondary | ICD-10-CM | POA: Diagnosis not present

## 2023-05-19 DIAGNOSIS — R2681 Unsteadiness on feet: Secondary | ICD-10-CM | POA: Diagnosis not present

## 2023-05-19 NOTE — Therapy (Signed)
OUTPATIENT PHYSICAL THERAPY NEURO TREATMENT   Patient Name: Robin Mercer MRN: [id number] DOB:06/22/2001, 22 y.o., female Today's Date: 05/19/2023   PCP: Carin Hock, PA  REFERRING PROVIDER: Asa Lente, MD   END OF SESSION:  PT End of Session - 05/19/23 0935     Visit Number 7    Number of Visits 9    Date for PT Re-Evaluation 06/06/23    Authorization Type [employer]    PT Start Time [phone]    PT Stop Time 1013    PT Time Calculation (min) 40 min    Equipment Utilized During Treatment Gait belt;Other (comment)   R foot up brace   Activity Tolerance Patient tolerated treatment well    Behavior During Therapy WFL for tasks assessed/performed                Past Medical History:  Diagnosis Date   Seasonal allergies    History reviewed. No pertinent surgical history. 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   Right foot drop 04/01/2023   High risk medication use 04/01/2023   Vitamin D deficiency 04/01/2023   Pyuria 04/13/2022   Marijuana use 04/11/2022   Multiple sclerosis (HCC) 04/11/2022    ONSET DATE: 04/01/2023 (date of referral)   REFERRING DIAG: G37.81 (ICD-10-CM) - Myelin oligodendrocyte glycoprotein antibody disorder (MOGAD) M21.371 (ICD-10-CM) - Right foot drop   THERAPY DIAG:  Foot drop, right  Unsteadiness on feet  Muscle weakness (generalized)  Rationale for Evaluation and Treatment: Rehabilitation  SUBJECTIVE:                                                                                                                                                                                             SUBJECTIVE STATEMENT: Had a good weekend. Notices she is starting to get some push off with gait when ambulating. Still having some tingling in her thigh.    Pt accompanied by: self  PERTINENT HISTORY: MOGAD, Anxiety  Per Dr. Epimenio Foot: Currently,  she has mild right leg weakness and mild reduced gait. She is better than last year.  She could easily walk a mile and mostly keeps up with others. She has had no falls. She still has right leg numbness and tingling, also better since the last visit. Bladder function has improved back to normal.  Vision is fine.   On Actemra once a week since June 2023   PAIN:  Are you having pain? No  PRECAUTIONS: None  WEIGHT BEARING RESTRICTIONS: No  FALLS: Has patient fallen in last 6 months? 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   PATIENT GOALS: Strengthen R leg, improve stairs, going back to running   OBJECTIVE:   DIAGNOSTIC FINDINGS: MRI of the brain 04/11/2022 showed multiple T2/FLAIR hyperintense foci in the periventricular, juxtacortical and deep white matter. None of the foci enhanced after contrast. 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  MRI of the lumbar spine 04/11/2022: There are T2 hyperintense foci adjacent to T10 and adjacent to T11-T12 (posterior). They do not enhance.  COGNITION: Overall cognitive status: Within functional limits for tasks assessed   SENSATION: Light touch: Impaired  and With RLE, pt with numbness/tingling in R thigh and decr light touch more distally.   COORDINATION: Heel to shin: WFL    POSTURE: No Significant postural limitations   LOWER EXTREMITY MMT:    MMT Right Eval Left Eval  Hip flexion 4 5  Hip extension    Hip abduction 3- 5  Hip adduction 5 5  Hip internal rotation    Hip external rotation    Knee flexion 4+ 5  Knee extension 4+ 4+  Ankle dorsiflexion 3- 5  Ankle plantarflexion    Ankle inversion    Ankle eversion    (Blank rows = not  tested)  All tested in sitting    TODAY'S TREATMENT:   Therapeutic Exercise: Elliptical for a total of 5 minutes: 3 minutes forwards and 2 minutes backwards at gear 1.0. Pt reporting RPE as a 7/10 and needing a seated rest break afterwards due to fatigue. RLE tremors noted during the last minute  Lunges x10 reps each side with BUE support and progressing to trying to not use UE support - cued for proper alignment and technique,  then progressed to mini lunge into contralateral march x10 reps each leg with UE support needed, with working on push off with RLE when posteriorly  Quadruped bird dogs for coordination, core activation, balance, hip extension strengthening, weight bearing through R side: 2 sets of 5 reps each side, pt more fatigued with incr reps, esp when weight bearing more on RLE  NMR: At bottom of staircase: lateral step ups with RLE on 6" step x12 reps, cues for glute med activation in stance on R side, performed with UE support > none, pt enjoying this exercise and also wanting to work on at home.  On blue foam beam: heel tap each time between tandem stance with gait, down and back x4 reps, intermittent UE support  Attempted skipping in hallway for push off with RLE with single UE support for balance, down and back in hallway, 4 x 15', pt more challenged with this as it was at the end of session   PATIENT EDUCATION: Education details: Continue HEP, adding more appts  Person educated: Patient Education method: Explanation, Demonstration, and Verbal cues Education comprehension: verbalized understanding and returned demonstration  HOME EXERCISE PROGRAM: Access Code: [email or web address] URL: [email or web address] Date: 05/12/2023 Prepared by: Sherlie Ban  Exercises - Forward Step Down with Heel Tap and Rail Support  - 1 x daily - 7 x weekly - 3 sets - 10 reps - Side Step Down with Counter Support  - 1 x daily - 7 x weekly - 3 sets - 10 reps - Side Stepping with  Resistance at Thighs and  Counter Support  - 1 x daily - 7 x weekly - 3-5 reps - Marching with Resistance  - 1 x daily - 7 x weekly - 3 sets - Romberg Stance Eyes Closed on Foam Pad  - 1 x daily - 7 x weekly - 3 sets - 30 hold - Walking with Head Rotation  - 1 x daily - 7 x weekly - 3 sets - Single Leg Heel Raise with Unilateral Counter Support  - 1 x daily - 7 x weekly - 3 sets - 10 reps - Standing Hip Extension with Chair  - 1 x daily - 7 x weekly - 3 sets - 10 reps - Clamshell with Resistance  - 1 x daily - 7 x weekly - 2 sets - 10 reps -RLE step ups on 6" step   GOALS: Goals reviewed with patient? Yes  SHORT TERM GOALS: Target date: 05/05/2023  Pt will be independent with initial HEP in order to build upon functional gains made in therapy. Baseline: Goal status: MET  2.  Pt will trial R foot up brace and R AFO to determine best option.  Baseline: Pt prefers AFO (5/20) Goal status: MET  3.  Pt will improve FGA to at least a 19/30 in order to demo decr fall risk Baseline: 15/30; 19/30 (6/10) Goal status: MET  4.  Pt will improve gait speed with no AD to at least 2.8 ft/sec in order to demo improved community mobility.   Baseline: 2.57 ft/sec; 3.01 ft/s  Goal status: MET    LONG TERM GOALS: Target date: 06/02/2023  Pt will be independent with final HEP in order to build upon functional gains made in therapy. Baseline:  Goal status: INITIAL  2.  Pt will improve FGA to at least a 23/30 in order to demo decr fall risk Baseline: 19/30 (6/10) Goal status: INITIAL  3.  Pt will improve gait speed with no AD to at least 3.4 ft/sec in order to demo improved community mobility.  Baseline: 3.01 ft/s (6/10) Goal status: REVISED  4.  Pt will perform stairs 12 stairs with reciprocal pattern without UE support with mod I in order to demo improvement with stairs.   Baseline: Alternating pattern, but does need railings at time for balance  Goal status: INITIAL  5.  Pt will be able to  jog at least 230' with supervision in order for return to jogging.  Baseline:  Goal status: INITIAL    ASSESSMENT:  CLINICAL IMPRESSION: Today's skilled session focused on RLE strengthening, SLS balance to RLE, weight bearing onto toes, and progressions towards jogging. Pt still needing intermittent UE support for RLE SLS activities or push off exercises for R PF to return to jogging. Attempted skipping at end of session for coordination/SLS/return to jogging, with decr push off noted, likely due to fatigue at end of session. Will continue to progress towards LTGs.   OBJECTIVE IMPAIRMENTS: Abnormal gait, decreased activity tolerance, decreased balance, difficulty walking, decreased ROM, decreased strength, impaired sensation, and pain.   ACTIVITY LIMITATIONS: stairs and locomotion level  PARTICIPATION LIMITATIONS:  jogging  PERSONAL FACTORS: Age, Time since onset of injury/illness/exacerbation, and 1-2 comorbidities: MOGAD, Anxiety  are also affecting patient's functional outcome.   REHAB POTENTIAL: Good  CLINICAL DECISION MAKING: Stable/uncomplicated  EVALUATION COMPLEXITY: Low  PLAN:  PT FREQUENCY: 1x/week  PT DURATION: 8 weeks  PLANNED INTERVENTIONS: Therapeutic exercises, Therapeutic activity, Neuromuscular re-education, Balance training, Gait training, Patient/Family education, Self Care, Stair training, Vestibular training, Orthotic/Fit training, DME  instructions, Electrical stimulation, Manual therapy, and Re-evaluation  PLAN FOR NEXT SESSION: continue A-skips, RDLS with blaze pods, stability on forefoot position. work on going down inclines. Continue Bioness for gait and exercises? (Pt is set up on tablet one). any update from [facility]? RDLs, TM training (try to incr speed).   hip ABD strength, RLE strength and balance (head turns, eyes closed, SLS).     Drake Leach, PT, DPT  05/19/2023, 11:57 AM

## 2023-05-19 NOTE — Telephone Encounter (Signed)
Received fax from [facility] that they were trying to reach [place] to further discuss prescription. Called them back at [phone]. Order# [id number].  Spoke w/ Byrd Hesselbach. States automated system cx rx we sent 05/12/23. They made rx active again. Pt just needs to call them to schedule delivery.  I called pt at [phone]. Provided her phone# [phone] to call and schedule shipment. She verbalized understanding and appreciation.

## 2023-05-26 ENCOUNTER — Ambulatory Visit: Payer: 59 | Attending: Neurology | Admitting: Physical Therapy

## 2023-05-26 ENCOUNTER — Encounter: Payer: Self-pay | Admitting: Physical Therapy

## 2023-05-26 DIAGNOSIS — R2681 Unsteadiness on feet: Secondary | ICD-10-CM | POA: Diagnosis not present

## 2023-05-26 DIAGNOSIS — R2689 Other abnormalities of gait and mobility: Secondary | ICD-10-CM | POA: Insufficient documentation

## 2023-05-26 DIAGNOSIS — M21371 Foot drop, right foot: Secondary | ICD-10-CM | POA: Diagnosis not present

## 2023-05-26 DIAGNOSIS — M6281 Muscle weakness (generalized): Secondary | ICD-10-CM | POA: Insufficient documentation

## 2023-05-26 NOTE — Therapy (Signed)
OUTPATIENT PHYSICAL THERAPY NEURO TREATMENT   Patient Name: Robin Mercer MRN: [id number] DOB:October 20, 2001, 22 y.o., female Today's Date: 05/26/2023   PCP: Carin Hock, PA  REFERRING PROVIDER: Asa Lente, MD   END OF SESSION:  PT End of Session - 05/26/23 0942     Visit Number 8    Number of Visits 9    Date for PT Re-Evaluation 06/06/23    Authorization Type [employer] Medicare    PT Start Time [phone]   pt late to session   PT Stop Time 1015    PT Time Calculation (min) 34 min    Equipment Utilized During Treatment Gait belt;Other (comment)   R foot up brace   Activity Tolerance Patient tolerated treatment well    Behavior During Therapy WFL for tasks assessed/performed                Past Medical History:  Diagnosis Date   Seasonal allergies    History reviewed. No pertinent surgical history. 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   Right foot drop 04/01/2023   High risk medication use 04/01/2023   Vitamin D deficiency 04/01/2023   Pyuria 04/13/2022   Marijuana use 04/11/2022   Multiple sclerosis (HCC) 04/11/2022    ONSET DATE: 04/01/2023 (date of referral)   REFERRING DIAG: G37.81 (ICD-10-CM) - Myelin oligodendrocyte glycoprotein antibody disorder (MOGAD) M21.371 (ICD-10-CM) - Right foot drop   THERAPY DIAG:  Foot drop, right  Unsteadiness on feet  Muscle weakness (generalized)  Other abnormalities of gait and mobility  Rationale for Evaluation and Treatment: Rehabilitation  SUBJECTIVE:                                                                                                                                                                                             SUBJECTIVE STATEMENT: People have been complimenting me on my walking. Was in [place] for the weekend - no issues or anything. [facility]ried [facility] and they did not have the order for  her AFO.   Pt accompanied by: self  PERTINENT HISTORY: MOGAD, Anxiety  Per Dr. Epimenio Foot: Currently, she has mild right leg weakness and mild reduced gait. She is better than last year.  She could easily walk a mile and mostly keeps up with others. She has had no falls. She still has right leg numbness and tingling, also better since the last visit. Bladder function has improved back to normal.  Vision is fine.   On Actemra once a week since June 2023   PAIN:  Are you having pain? No  4/10, RLE pain. Has tingling in the top of her R leg.   PRECAUTIONS: None  WEIGHT BEARING RESTRICTIONS: No  FALLS: Has patient fallen in last 6 months? 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   PATIENT GOALS: Strengthen R leg, improve stairs, going back to running   OBJECTIVE:   DIAGNOSTIC FINDINGS: MRI of the brain 04/11/2022 showed multiple T2/FLAIR hyperintense foci in the periventricular, juxtacortical and deep white matter. None of the foci enhanced after contrast. 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  MRI of the lumbar spine 04/11/2022: There are T2 hyperintense foci adjacent to T10 and adjacent to T11-T12 (posterior). They do not enhance.  COGNITION: Overall cognitive status: Within functional limits for tasks assessed   SENSATION: Light touch: Impaired  and With RLE, pt with numbness/tingling in R thigh and decr light touch more distally.   COORDINATION: Heel to shin: WFL    POSTURE: No Significant postural limitations   LOWER EXTREMITY MMT:    MMT Right Eval Left Eval  Hip flexion 4 5  Hip extension    Hip abduction 3- 5  Hip adduction 5 5  Hip internal rotation    Hip external rotation    Knee  flexion 4+ 5  Knee extension 4+ 4+  Ankle dorsiflexion 3- 5  Ankle plantarflexion    Ankle inversion    Ankle eversion    (Blank rows = not tested)  All tested in sitting    TODAY'S TREATMENT:   Therapeutic Exercise: Treadmill with BUE support at 2.1 mph for 2 minutes for warm-up, and an additional 2.5 mph for 2:30 minutes for more speed walking, pt reporting RPE as a 7/10 afterwards  Single leg bridging x6 reps each side, with PT having to assist pt's RLE bending into knee flexed position and keeping R ankle in a neutral position   NMR: Speed walking over level ground - cued to walk as fast as pt can (slight incr in speed noted) and then normal speed for for a total of 345', no LOB  SLS 3 x 5 reps bilat with pt holding 4# medicine ball and bouncing it off the ground and catching it for anticipatory balance, CGA for balance, with pt standing on RLE, pt able to perform 2 reps in SLS, otherwise remainder of reps pt performing a modified SLS with L toes on the ground. Pt with incr postural sway when standing on LLE, but able to perform majority with SLS  With 4 blaze pods on mat table, on single random color, for SLS, coordination, weight bearing, coordination. Pt alternating between each legs, performed a SLS RDL, cues for proper technique. Pt needing to tap mat table intermittently with RUE for balance  Performed 3 bouts of 1 minute each with 30 second rest break in between: 14 hits, 15 hits, 18 hits     PATIENT EDUCATION: Education details: Continue HEP, checking goals at next session  Person educated: Patient Education method: Explanation, Demonstration, and Verbal cues Education comprehension: verbalized understanding and returned demonstration  HOME EXERCISE PROGRAM: Access Code: [email or web address] URL: [email or web address] Date: 05/12/2023 Prepared by: Sherlie Ban  Exercises - Forward Step Down with Heel Tap and Rail Support  - 1 x daily - 7 x weekly - 3 sets -  10  reps - Side Step Down with Counter Support  - 1 x daily - 7 x weekly - 3 sets - 10 reps - Side Stepping with Resistance at Thighs and Counter Support  - 1 x daily - 7 x weekly - 3-5 reps - Marching with Resistance  - 1 x daily - 7 x weekly - 3 sets - Romberg Stance Eyes Closed on Foam Pad  - 1 x daily - 7 x weekly - 3 sets - 30 hold - Walking with Head Rotation  - 1 x daily - 7 x weekly - 3 sets - Single Leg Heel Raise with Unilateral Counter Support  - 1 x daily - 7 x weekly - 3 sets - 10 reps - Standing Hip Extension with Chair  - 1 x daily - 7 x weekly - 3 sets - 10 reps - Clamshell with Resistance  - 1 x daily - 7 x weekly - 2 sets - 10 reps -RLE step ups on 6" step   GOALS: Goals reviewed with patient? Yes  SHORT TERM GOALS: Target date: 05/05/2023  Pt will be independent with initial HEP in order to build upon functional gains made in therapy. Baseline: Goal status: MET  2.  Pt will trial R foot up brace and R AFO to determine best option.  Baseline: Pt prefers AFO (5/20) Goal status: MET  3.  Pt will improve FGA to at least a 19/30 in order to demo decr fall risk Baseline: 15/30; 19/30 (6/10) Goal status: MET  4.  Pt will improve gait speed with no AD to at least 2.8 ft/sec in order to demo improved community mobility.   Baseline: 2.57 ft/sec; 3.01 ft/s  Goal status: MET    LONG TERM GOALS: Target date: 06/02/2023  Pt will be independent with final HEP in order to build upon functional gains made in therapy. Baseline:  Goal status: INITIAL  2.  Pt will improve FGA to at least a 23/30 in order to demo decr fall risk Baseline: 19/30 (6/10) Goal status: INITIAL  3.  Pt will improve gait speed with no AD to at least 3.4 ft/sec in order to demo improved community mobility.  Baseline: 3.01 ft/s (6/10) Goal status: REVISED  4.  Pt will perform stairs 12 stairs with reciprocal pattern without UE support with mod I in order to demo improvement with stairs.   Baseline:  Alternating pattern, but does need railings at time for balance  Goal status: INITIAL  5.  Pt will be able to jog at least 230' with supervision in order for return to jogging.  Baseline:  Goal status: INITIAL    ASSESSMENT:  CLINICAL IMPRESSION: Session limited today due to pt arriving late. Started on the treadmill - working on gradually incr speed of walking to 2.5 mph, with pt able to demo good foot clearance with RLE with foot up brace, but needed to use BUE support for balance and pt reporting an RPE of 7/10. Remainder of session focused on dynamic SLS tasks for coordination/anticipatory balance and SLS bridging for core activation/weight bearing. With single leg bridging, PT helped keep ankle in a neutral position as some ankle instability noted. Will continue to progress towards LTGs.   OBJECTIVE IMPAIRMENTS: Abnormal gait, decreased activity tolerance, decreased balance, difficulty walking, decreased ROM, decreased strength, impaired sensation, and pain.   ACTIVITY LIMITATIONS: stairs and locomotion level  PARTICIPATION LIMITATIONS:  jogging  PERSONAL FACTORS: Age, Time since onset of injury/illness/exacerbation, and 1-2 comorbidities: MOGAD,  Anxiety  are also affecting patient's functional outcome.   REHAB POTENTIAL: Good  CLINICAL DECISION MAKING: Stable/uncomplicated  EVALUATION COMPLEXITY: Low  PLAN:  PT FREQUENCY: 1x/week  PT DURATION: 8 weeks  PLANNED INTERVENTIONS: Therapeutic exercises, Therapeutic activity, Neuromuscular re-education, Balance training, Gait training, Patient/Family education, Self Care, Stair training, Vestibular training, Orthotic/Fit training, DME instructions, Electrical stimulation, Manual therapy, and Re-evaluation  PLAN FOR NEXT SESSION: CHECK GOALS and DO RE-CERT for an additional 1x week for 4 weeks.    Continue to try skipping, stability on forefoot position. work on going down inclines and ankle strengthening.  Continue Bioness for  gait and exercises? (Pt is set up on tablet one).  RDLs, TM training (try to incr speed).     Drake Leach, PT, DPT  05/26/2023, 10:30 AM

## 2023-05-28 ENCOUNTER — Ambulatory Visit: Payer: 59 | Admitting: Physical Therapy

## 2023-06-02 ENCOUNTER — Ambulatory Visit: Payer: 59 | Admitting: Physical Therapy

## 2023-06-02 DIAGNOSIS — M21371 Foot drop, right foot: Secondary | ICD-10-CM | POA: Diagnosis not present

## 2023-06-02 DIAGNOSIS — M6281 Muscle weakness (generalized): Secondary | ICD-10-CM

## 2023-06-02 DIAGNOSIS — R2681 Unsteadiness on feet: Secondary | ICD-10-CM | POA: Diagnosis not present

## 2023-06-02 DIAGNOSIS — R2689 Other abnormalities of gait and mobility: Secondary | ICD-10-CM | POA: Diagnosis not present

## 2023-06-02 NOTE — Therapy (Signed)
OUTPATIENT PHYSICAL THERAPY NEURO TREATMENT- RECERTIFICATION   Patient Name: Appolonia Bernstein MRN: [id number] DOB:[birth year], 22 y.o., female Today's Date: 06/02/2023   PCP: Carin Hock, PA  REFERRING PROVIDER: Asa Lente, MD   END OF SESSION:  PT End of Session - 06/02/23 1104     Visit Number 9    Number of Visits 13   Recert   Date for PT Re-Evaluation 07/14/23   Recert (due to delay in scheduling)   Authorization Type [employer]    Progress Note Due on Visit 10    PT Start Time 1102    PT Stop Time 1143    PT Time Calculation (min) 41 min    Equipment Utilized During Treatment Other (comment)   R foot up brace   Activity Tolerance Patient tolerated treatment well    Behavior During Therapy WFL for tasks assessed/performed                 Past Medical History:  Diagnosis Date   Seasonal allergies    No past surgical history on file. 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   Right foot drop 04/01/2023   High risk medication use 04/01/2023   Vitamin D deficiency 04/01/2023   Pyuria 04/13/2022   Marijuana use 04/11/2022   Multiple sclerosis (HCC) 04/11/2022    ONSET DATE: 04/01/2023 (date of referral)   REFERRING DIAG: G37.81 (ICD-10-CM) - Myelin oligodendrocyte glycoprotein antibody disorder (MOGAD) M21.371 (ICD-10-CM) - Right foot drop   THERAPY DIAG:  Foot drop, right  Muscle weakness (generalized)  Other abnormalities of gait and mobility  Unsteadiness on feet  Rationale for Evaluation and Treatment: Rehabilitation  SUBJECTIVE:                                                                                                                                                                                             SUBJECTIVE STATEMENT: Has not heard from [facility], will call today. Having more pain in her RLE today, rated it as 8/10 this morning. Is  down to a 3-4/10 now.   Pt accompanied by: self  PERTINENT HISTORY: MOGAD, Anxiety  Per Dr. Epimenio Foot: Currently, she has mild right leg weakness and mild reduced gait. She is better than last year.  She could easily walk a mile and mostly keeps up with others. She has had no falls. She still has right leg numbness and tingling, also better since the last visit. Bladder function has improved back to normal.  Vision is fine.   On Actemra once a week since June 2023  PAIN:  Are you having pain? Yes: NPRS scale: 3-4/10 Pain location: RLE Pain description: achy    PRECAUTIONS: None  WEIGHT BEARING RESTRICTIONS: No  FALLS: Has patient fallen in last 6 months? 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   PATIENT GOALS: Strengthen R leg, improve stairs, going back to running   OBJECTIVE:   DIAGNOSTIC FINDINGS: MRI of the brain 04/11/2022 showed multiple T2/FLAIR hyperintense foci in the periventricular, juxtacortical and deep white matter. None of the foci enhanced after contrast. 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  MRI of the lumbar spine 04/11/2022: There are T2 hyperintense foci adjacent to T10 and adjacent to T11-T12 (posterior). They do not enhance.  COGNITION: Overall cognitive status: Within functional limits for tasks assessed   SENSATION: Light touch: Impaired  and With RLE, pt with numbness/tingling in R thigh and decr light touch more distally.   COORDINATION: Heel to shin: WFL    POSTURE: No Significant postural limitations   LOWER EXTREMITY MMT:    MMT Right Eval Left Eval  Hip flexion 4 5  Hip extension    Hip abduction 3- 5  Hip adduction 5 5  Hip internal rotation    Hip  external rotation    Knee flexion 4+ 5  Knee extension 4+ 4+  Ankle dorsiflexion 3- 5  Ankle plantarflexion    Ankle inversion    Ankle eversion    (Blank rows = not tested)  All tested in sitting    TODAY'S TREATMENT:   Therapeutic Activity  LTG Assessment    [facility] PT Assessment - 06/02/23 1112       Ambulation/Gait   Gait velocity 32.8' over 11.41s = 2.9 ft/s w/R foot-up brace      Functional Gait  Assessment   Gait assessed  Yes    Gait Level Surface Walks 20 ft in less than 7 sec but greater than 5.5 sec, uses assistive device, slower speed, mild gait deviations, or deviates 6-10 in outside of the 12 in walkway width.   6.12s   Change in Gait Speed Makes only minor adjustments to walking speed, or accomplishes a change in speed with significant gait deviations, deviates 10-15 in outside the 12 in walkway width, or changes speed but loses balance but is able to recover and continue walking.    Gait with Horizontal Head Turns Performs head turns smoothly with no change in gait. Deviates no more than 6 in outside 12 in walkway width    Gait with Vertical Head Turns Performs head turns with no change in gait. Deviates no more than 6 in outside 12 in walkway width.    Gait and Pivot Turn Pivot turns safely within 3 sec and stops quickly with no loss of balance.    Step Over Obstacle Is able to step over 2 stacked shoe boxes taped together (9 in total height) without changing gait speed. No evidence of imbalance.    Gait with Narrow Base of Support Is able to ambulate for 10 steps heel to toe with no staggering.    Gait with Eyes Closed Walks 20 ft, uses assistive device, slower speed, mild gait deviations, deviates 6-10 in outside 12 in walkway width. Ambulates 20 ft in less than 9 sec but  greater than 7 sec.   9.97   Ambulating Backwards Walks 20 ft, uses assistive device, slower speed, mild gait deviations, deviates 6-10 in outside 12 in walkway width.   12.62s   Steps Alternating  feet, must use rail.    Total Score 24    FGA comment: Medium fall risk            Ther Ex  The following stretches were performed due to pt's request to stretch R hip: Modified Thomas Stretch on RLE, x3 minutes. Pt did not feel a stretch with this  Low lunge stretch w/sidebend, x2 minutes per side. Pt enjoyed this stretch and reported her hip feeling better afterwards.   NMR  4 Blaze pods on random reach setting w/5s timeout for improved speed, agility and UE/LE coordination.  Performed on 2.5 minute intervals with 1 minute rest periods.  Pt requires SBA guarding. Round 1:  lateral setup over 15' placed 8' in front of mat. Pt to start at mat, shuttle run towards pod and tap w/hand and then retro shuttle run back to mat prior to tapping next pod. 24 hits, 6 misses. Round 2:  lateral setup over 20' runway w/emphasis on quick sidestepping.  13 hits, 17 misses. Notable errors/deficits:  increased difficulty w/retro stepping and coordination of RLE  RPE of 6/10 following activity     PATIENT EDUCATION: Education details: Continue HEP, goal outcomes  Person educated: Patient Education method: Explanation Education comprehension: verbalized understanding  HOME EXERCISE PROGRAM: Access Code: [email or web address] URL: [email or web address] Date: 05/12/2023 Prepared by: Sherlie Ban  Exercises - Forward Step Down with Heel Tap and Rail Support  - 1 x daily - 7 x weekly - 3 sets - 10 reps - Side Step Down with Counter Support  - 1 x daily - 7 x weekly - 3 sets - 10 reps - Side Stepping with Resistance at Thighs and Counter Support  - 1 x daily - 7 x weekly - 3-5 reps - Marching with Resistance  - 1 x daily - 7 x weekly - 3 sets - Romberg Stance Eyes Closed on Foam Pad  - 1 x daily - 7 x weekly - 3 sets - 30 hold - Walking with Head Rotation  - 1 x daily - 7 x weekly - 3 sets - Single Leg Heel Raise with Unilateral Counter Support  - 1 x daily - 7 x weekly - 3 sets - 10 reps -  Standing Hip Extension with Chair  - 1 x daily - 7 x weekly - 3 sets - 10 reps - Clamshell with Resistance  - 1 x daily - 7 x weekly - 2 sets - 10 reps -RLE step ups on 6" step   GOALS: Goals reviewed with patient? Yes  SHORT TERM GOALS: Target date: 05/05/2023  Pt will be independent with initial HEP in order to build upon functional gains made in therapy. Baseline: Goal status: MET  2.  Pt will trial R foot up brace and R AFO to determine best option.  Baseline: Pt prefers AFO (5/20) Goal status: MET  3.  Pt will improve FGA to at least a 19/30 in order to demo decr fall risk Baseline: 15/30; 19/30 (6/10) Goal status: MET  4.  Pt will improve gait speed with no AD to at least 2.8 ft/sec in order to demo improved community mobility.   Baseline: 2.57 ft/sec; 3.01 ft/s  Goal status: MET    LONG TERM GOALS: Target date: 06/02/2023  Pt  will be independent with final HEP in order to build upon functional gains made in therapy. Baseline:  Goal status: IN PROGRESS  2.  Pt will improve FGA to at least a 23/30 in order to demo decr fall risk Baseline: 19/30 (6/10); 24/30  Goal status: MET  3.  Pt will improve gait speed with no AD to at least 3.4 ft/sec in order to demo improved community mobility.  Baseline: 3.01 ft/s (6/10); 2.9 ft/s (7/8)  Goal status: IN PROGRESS  4.  Pt will perform stairs 12 stairs with reciprocal pattern without UE support with mod I in order to demo improvement with stairs.   Baseline: Alternating pattern, but does need railings at time for balance; requires single rail for balance w/alternating pattern  Goal status: NOT MET  5.  Pt will be able to jog at least 230' with supervision in order for return to jogging.  Baseline:  Goal status: IN PROGRESS   NEW LONG TERM GOALS FOR UPDATED POC:  Target date: 06/30/2023  Pt will be independent with final HEP in order to build upon functional gains made in therapy. Baseline:  Goal status: IN PROGRESS  2.   Pt will be able to jog at least 230' with supervision in order for return to jogging.  Baseline:  Goal status: IN PROGRESS  3.  Pt will improve gait speed with no AD to at least 3.4 ft/sec in order to demo improved community mobility.  Baseline: 3.01 ft/s (6/10); 2.9 ft/s (7/8)  Goal status: IN PROGRESS     ASSESSMENT:  CLINICAL IMPRESSION: Emphasis of skilled PT session on LTG assessment, agility and hip mobility. Pt has met 1 of 5 LTGs, improving her score on FGA to 24/30, indicative of medium fall risk but a good improvement from her baseline score of 16/30. Pt is progressing towards her gait speed, HEP and jogging goals but continues to be limited by decreased speed and RLE weakness. Pt did not meet her stair goal due to need of using a rail for safety, but is mod I w/alternating pattern and single rail on R. Pt in agreement to recert for 1x/week for 4 weeks to work on jogging and improved RLE strength, as pt is motivated to return to running. Pt has not heard from [facility] regarding AFO order, pt to call again today. Continue POC.   OBJECTIVE IMPAIRMENTS: Abnormal gait, decreased activity tolerance, decreased balance, difficulty walking, decreased ROM, decreased strength, impaired sensation, and pain.   ACTIVITY LIMITATIONS: stairs and locomotion level  PARTICIPATION LIMITATIONS:  jogging  PERSONAL FACTORS: Age, Time since onset of injury/illness/exacerbation, and 1-2 comorbidities: MOGAD, Anxiety  are also affecting patient's functional outcome.   REHAB POTENTIAL: Good  CLINICAL DECISION MAKING: Stable/uncomplicated  EVALUATION COMPLEXITY: Low  PLAN:  PT FREQUENCY: 1x/week  PT DURATION: 8 weeks + 4 weeks (recert)   PLANNED INTERVENTIONS: Therapeutic exercises, Therapeutic activity, Neuromuscular re-education, Balance training, Gait training, Patient/Family education, Self Care, Stair training, Vestibular training, Orthotic/Fit training, DME instructions, Electrical  stimulation, Manual therapy, and Re-evaluation  PLAN FOR NEXT SESSION:    Continue to try skipping, stability on forefoot position. work on going down inclines and ankle strengthening.  Continue Bioness for gait and exercises? (Pt is set up on tablet one).  RDLs, TM training (try to incr speed).     Jill Alexanders Osiel Stick, PT, DPT  06/02/2023, 12:15 PM

## 2023-06-09 ENCOUNTER — Ambulatory Visit: Payer: 59 | Admitting: Physical Therapy

## 2023-06-09 ENCOUNTER — Encounter: Payer: Self-pay | Admitting: Physical Therapy

## 2023-06-09 DIAGNOSIS — R2689 Other abnormalities of gait and mobility: Secondary | ICD-10-CM

## 2023-06-09 DIAGNOSIS — M21371 Foot drop, right foot: Secondary | ICD-10-CM

## 2023-06-09 DIAGNOSIS — M6281 Muscle weakness (generalized): Secondary | ICD-10-CM

## 2023-06-09 DIAGNOSIS — R2681 Unsteadiness on feet: Secondary | ICD-10-CM | POA: Diagnosis not present

## 2023-06-09 NOTE — Therapy (Signed)
OUTPATIENT PHYSICAL THERAPY NEURO TREATMENT   Patient Name: Robin Mercer MRN: [id number] DOB:October 02, 2001, 22 y.o., female Today's Date: 06/09/2023   PCP: Carin Hock, PA  REFERRING PROVIDER: Asa Lente, MD   END OF SESSION:  PT End of Session - 06/09/23 0935     Visit Number 10    Number of Visits 13   Recert   Date for PT Re-Evaluation 07/14/23   Recert (due to delay in scheduling)   Authorization Type [employer]    Progress Note Due on Visit 10    PT Start Time 0934    PT Stop Time 1016    PT Time Calculation (min) 42 min    Equipment Utilized During Treatment Other (comment);Gait belt    Activity Tolerance Patient tolerated treatment well    Behavior During Therapy WFL for tasks assessed/performed                 Past Medical History:  Diagnosis Date   Seasonal allergies    History reviewed. No pertinent surgical history. 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   Right foot drop 04/01/2023   High risk medication use 04/01/2023   Vitamin D deficiency 04/01/2023   Pyuria 04/13/2022   Marijuana use 04/11/2022   Multiple sclerosis (HCC) 04/11/2022    ONSET DATE: 04/01/2023 (date of referral)   REFERRING DIAG: G37.81 (ICD-10-CM) - Myelin oligodendrocyte glycoprotein antibody disorder (MOGAD) M21.371 (ICD-10-CM) - Right foot drop   THERAPY DIAG:  Muscle weakness (generalized)  Foot drop, right  Other abnormalities of gait and mobility  Rationale for Evaluation and Treatment: Rehabilitation  SUBJECTIVE:                                                                                                                                                                                             SUBJECTIVE STATEMENT: Can do tippy toes with her calf raises now. Can do it now without holding onto anything. Has appt with [facility] on the 29th.   Pt accompanied  by: self  PERTINENT HISTORY: MOGAD, Anxiety  Per Dr. Epimenio Foot: Currently, she has mild right leg weakness and mild reduced gait. She is better than last year.  She could easily walk a mile and mostly keeps up with others. She has had no falls. She still has right leg numbness and tingling, also better since the last visit. Bladder function has improved back to normal.  Vision is fine.   On Actemra once a week since June 2023   PAIN:  Are you having pain? Yes: NPRS scale: 2-3/10  Pain location: RLE Pain description: achy    PRECAUTIONS: None  WEIGHT BEARING RESTRICTIONS: No  FALLS: Has patient fallen in last 6 months? 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   PATIENT GOALS: Strengthen R leg, improve stairs, going back to running   OBJECTIVE:   DIAGNOSTIC FINDINGS: MRI of the brain 04/11/2022 showed multiple T2/FLAIR hyperintense foci in the periventricular, juxtacortical and deep white matter. None of the foci enhanced after contrast. 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  MRI of the lumbar spine 04/11/2022: There are T2 hyperintense foci adjacent to T10 and adjacent to T11-T12 (posterior). They do not enhance.  COGNITION: Overall cognitive status: Within functional limits for tasks assessed   SENSATION: Light touch: Impaired  and With RLE, pt with numbness/tingling in R thigh and decr light touch more distally.   COORDINATION: Heel to shin: WFL    POSTURE: No Significant postural limitations   LOWER EXTREMITY MMT:    MMT Right Eval Left Eval  Hip flexion 4 5  Hip extension    Hip abduction 3- 5  Hip adduction 5 5  Hip internal rotation    Hip external rotation    Knee flexion 4+ 5   Knee extension 4+ 4+  Ankle dorsiflexion 3- 5  Ankle plantarflexion    Ankle inversion    Ankle eversion    (Blank rows = not tested)  All tested in sitting    TODAY'S TREATMENT:    Therapeutic Exercise  Treadmill with BUE support at 2.1 mph for 2 minutes for warm-up, and an additional 2.5 mph for 2 minutes for more speed walking, performed cool down for 1:30 seconds at 1.7 mph, pt reporting RPE as a 5/10 afterwards. Pt reporting this felt easier at this speed today compared to previous session  Performed x7 reps bird dogs in quadruped each side, for coordination, core activation, hip extensor strengthening  NMR  6 Blaze pods on random reach setting for improved speed, agility, UE/LE coordination and working on forwards/retro gait.  Performed on 2.5 minute intervals with 1 minute rest periods.  Pt requires SBA/CGA at times when more fatigued and for balance in the retro direction. Had pt start off in the middle and had 3 blaze pods on each side of pt a few feet away. Pt would go and tap with foot and then perform retro gait back to starting point before tapping next blaze pod  Round 1:  Performed in 2 minutes 30 seconds: 34 hits with 5 second timeout Round 2: Performed in 2 minutes 30 seconds: 22 hits with 3.5 second timeout, 9 missed hits   With pt standing in front of ballet bar with resistance around pt's pelvis for resistance in the forwards direction and control in the eccentric direction, continuing to work on speed, agility, coordination, performed 4 blaze pods set up a few feet away, PT needing to provide CGA for balance, pt having to bend down and tap blaze pod with hand and then come back up and retro step back to the starting point Performed with orange resistance, 1:30 and tapping with 3 second timeout, 17 hits, 4 missed hits  Performed with green resistance, 1:30 and tapping with 2.5 second timeout, 14 hits, 7 missed hits, pt needing one  episode of min A to prevent having a  fall when pt bending down to tap forwards anteriorly   Mini squats with raising up onto toes x5 reps with pt needing UE support for balance when coming up onto toes, then performed x5 reps mini squat with jump with pt able to perform with no UE support, cues for shifting weight back posteriorly on heels with mini squat   PATIENT EDUCATION: Education details: Continue HEP, can try working on mini squats with bilateral heel raise for home to work on Child psychotherapist  Person educated: Patient Education method: Medical illustrator Education comprehension: verbalized understanding and returned demonstration  HOME EXERCISE PROGRAM: Access Code: [email or web address] URL: [email or web address] Date: 05/12/2023 Prepared by: Sherlie Ban  Exercises - Forward Step Down with Heel Tap and Rail Support  - 1 x daily - 7 x weekly - 3 sets - 10 reps - Side Step Down with Counter Support  - 1 x daily - 7 x weekly - 3 sets - 10 reps - Side Stepping with Resistance at Thighs and Counter Support  - 1 x daily - 7 x weekly - 3-5 reps - Marching with Resistance  - 1 x daily - 7 x weekly - 3 sets - Romberg Stance Eyes Closed on Foam Pad  - 1 x daily - 7 x weekly - 3 sets - 30 hold - Walking with Head Rotation  - 1 x daily - 7 x weekly - 3 sets - Single Leg Heel Raise with Unilateral Counter Support  - 1 x daily - 7 x weekly - 3 sets - 10 reps - Standing Hip Extension with Chair  - 1 x daily - 7 x weekly - 3 sets - 10 reps - Clamshell with Resistance  - 1 x daily - 7 x weekly - 2 sets - 10 reps -RLE step ups on 6" step  -Mini squat with heel raise   GOALS: Goals reviewed with patient? Yes   LONG TERM GOALS: Target date: 06/02/2023  Pt will be independent with final HEP in order to build upon functional gains made in therapy. Baseline:  Goal status: IN PROGRESS  2.  Pt will improve FGA to at least a 23/30 in order to demo decr fall risk Baseline: 19/30 (6/10); 24/30  Goal status:  MET  3.  Pt will improve gait speed with no AD to at least 3.4 ft/sec in order to demo improved community mobility.  Baseline: 3.01 ft/s (6/10); 2.9 ft/s (7/8)  Goal status: IN PROGRESS  4.  Pt will perform stairs 12 stairs with reciprocal pattern without UE support with mod I in order to demo improvement with stairs.   Baseline: Alternating pattern, but does need railings at time for balance; requires single rail for balance w/alternating pattern  Goal status: NOT MET  5.  Pt will be able to jog at least 230' with supervision in order for return to jogging.  Baseline:  Goal status: IN PROGRESS   NEW LONG TERM GOALS FOR UPDATED POC:  Target date: 06/30/2023  Pt will be independent with final HEP in order to build upon functional gains made in therapy. Baseline:  Goal status: IN PROGRESS  2.  Pt will be able to jog at least 230' with supervision in order for return to jogging.  Baseline:  Goal status: IN PROGRESS  3.  Pt will improve gait speed with no AD to at least 3.4 ft/sec in order to demo improved community mobility.  Baseline: 3.01 ft/s (  6/10); 2.9 ft/s (7/8)  Goal status: IN PROGRESS     ASSESSMENT:  CLINICAL IMPRESSION: Today's skilled session continued to focus on agility, speed, coordination, and push off activities for pt to return to jogging. Worked on [employer], with pt working on quick walking forwards and then retro gait after tapping each pod, progressing to use of resistance around pelvis. Pt most challenged with retro gait with RLE, needing CGA mainly for balance when more fatigued. Pt able to progress to mini squat jumps today with no UE support, but does need CGA for balance/ intermittent UE taps to chair for balance. With warm-up on treadmill, pt reports less RPE with speed walking compared to when performed in previous sessions. Will continue to progress towards LTGs.    OBJECTIVE IMPAIRMENTS: Abnormal gait, decreased activity tolerance, decreased balance,  difficulty walking, decreased ROM, decreased strength, impaired sensation, and pain.   ACTIVITY LIMITATIONS: stairs and locomotion level  PARTICIPATION LIMITATIONS:  jogging  PERSONAL FACTORS: Age, Time since onset of injury/illness/exacerbation, and 1-2 comorbidities: MOGAD, Anxiety  are also affecting patient's functional outcome.   REHAB POTENTIAL: Good  CLINICAL DECISION MAKING: Stable/uncomplicated  EVALUATION COMPLEXITY: Low  PLAN:  PT FREQUENCY: 1x/week  PT DURATION: 8 weeks + 4 weeks (recert)   PLANNED INTERVENTIONS: Therapeutic exercises, Therapeutic activity, Neuromuscular re-education, Balance training, Gait training, Patient/Family education, Self Care, Stair training, Vestibular training, Orthotic/Fit training, DME instructions, Electrical stimulation, Manual therapy, and Re-evaluation  PLAN FOR NEXT SESSION:  Resisted retro gait, trampoline drills for coordination, quick feet with agility ladder  Blaze pod activities with speed.   Continue to try skipping, stability on forefoot position. work on going down inclines and ankle strengthening.  Continue Bioness for gait and exercises? (Pt is set up on tablet one).  RDLs, TM training (try to incr speed).     Drake Leach, PT, DPT  06/09/2023, 10:23 AM

## 2023-06-16 ENCOUNTER — Ambulatory Visit: Payer: 59 | Admitting: Physical Therapy

## 2023-06-16 ENCOUNTER — Encounter: Payer: Self-pay | Admitting: Physical Therapy

## 2023-06-16 DIAGNOSIS — R2681 Unsteadiness on feet: Secondary | ICD-10-CM

## 2023-06-16 DIAGNOSIS — M21371 Foot drop, right foot: Secondary | ICD-10-CM

## 2023-06-16 DIAGNOSIS — R2689 Other abnormalities of gait and mobility: Secondary | ICD-10-CM

## 2023-06-16 DIAGNOSIS — M6281 Muscle weakness (generalized): Secondary | ICD-10-CM | POA: Diagnosis not present

## 2023-06-16 NOTE — Therapy (Signed)
OUTPATIENT PHYSICAL THERAPY NEURO TREATMENT   Patient Name: Robin Mercer MRN: [id number] DOB:2001/04/29, 22 y.o., female Today's Date: 06/16/2023   PCP: Carin Hock, PA  REFERRING PROVIDER: Asa Lente, MD   END OF SESSION:  PT End of Session - 06/16/23 0940     Visit Number 11    Number of Visits 13   Recert   Date for PT Re-Evaluation 07/14/23   Recert (due to delay in scheduling)   Authorization Type [employer]    Progress Note Due on Visit 10    PT Start Time [phone]   pt late to session   PT Stop Time 1016    PT Time Calculation (min) 38 min    Equipment Utilized During Treatment Other (comment);Gait belt    Activity Tolerance Patient tolerated treatment well    Behavior During Therapy WFL for tasks assessed/performed                 Past Medical History:  Diagnosis Date   Seasonal allergies    History reviewed. No pertinent surgical history. 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   Right foot drop 04/01/2023   High risk medication use 04/01/2023   Vitamin D deficiency 04/01/2023   Pyuria 04/13/2022   Marijuana use 04/11/2022   Multiple sclerosis (HCC) 04/11/2022    ONSET DATE: 04/01/2023 (date of referral)   REFERRING DIAG: G37.81 (ICD-10-CM) - Myelin oligodendrocyte glycoprotein antibody disorder (MOGAD) M21.371 (ICD-10-CM) - Right foot drop   THERAPY DIAG:  Muscle weakness (generalized)  Other abnormalities of gait and mobility  Foot drop, right  Unsteadiness on feet  Rationale for Evaluation and Treatment: Rehabilitation  SUBJECTIVE:                                                                                                                                                                                             SUBJECTIVE STATEMENT: Notes clamshells are still challenging for her. Ordered a walking pad to go under her desk and it  is still on the way.   Pt accompanied by: self  PERTINENT HISTORY: MOGAD, Anxiety  Per Dr. Epimenio Foot: Currently, she has mild right leg weakness and mild reduced gait. She is better than last year.  She could easily walk a mile and mostly keeps up with others. She has had no falls. She still has right leg numbness and tingling, also better since the last visit. Bladder function has improved back to normal.  Vision is fine.   On Actemra once a week since June 2023   PAIN:  Are you having pain? Yes: NPRS scale: 2-3/10 Pain location: RLE Pain description: achy    PRECAUTIONS: None  WEIGHT BEARING RESTRICTIONS: No  FALLS: Has patient fallen in last 6 months? 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   PATIENT GOALS: Strengthen R leg, improve stairs, going back to running   OBJECTIVE:   DIAGNOSTIC FINDINGS: MRI of the brain 04/11/2022 showed multiple T2/FLAIR hyperintense foci in the periventricular, juxtacortical and deep white matter. None of the foci enhanced after contrast. 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  MRI of the lumbar spine 04/11/2022: There are T2 hyperintense foci adjacent to T10 and adjacent to T11-T12 (posterior). They do not enhance.  COGNITION: Overall cognitive status: Within functional limits for tasks assessed   SENSATION: Light touch: Impaired  and With RLE, pt with numbness/tingling in R thigh and decr light touch more distally.   COORDINATION: Heel to shin: WFL    POSTURE: No Significant postural limitations   LOWER EXTREMITY MMT:    MMT Right Eval Left Eval  Hip flexion 4 5  Hip extension    Hip abduction 3- 5  Hip adduction 5 5  Hip internal rotation    Hip  external rotation    Knee flexion 4+ 5  Knee extension 4+ 4+  Ankle dorsiflexion 3- 5  Ankle plantarflexion    Ankle inversion    Ankle eversion    (Blank rows = not tested)  All tested in sitting    TODAY'S TREATMENT:    NMR  On trampoline for speed, coordination, agility, PF strengthening, balance:  Jumping in and out x20 reps with BUE support  Jumping in R/L diagonals x10 reps each side with BUE support  Alternating jumping between R and L diagonals for coordination x20 reps with BUE support  Jumping with no UE support 3 x 20 seconds  Slow marching for dynamic SLS 2 x 10 reps, pt needing intermittent UE support for balance and min A from therapist  With blue t-band resistance around pelvis by 2nd PT and primary PT providing min guard/min A for balance 345' x1 forwards resisted walking, with starting with steady resistance and then varying resistance for reactive balance, pt able to keep balance with appropriate stepping strategy, except for one instance of min A from therapist due to pt almost tripping over R foot. Cues to slowly incr gait speed as able  6 x 20' retro gait with steady and then varying resistance, CGA for balnce, cues for wider BOS, pt initially more slow, but with incr reps, pt able to improve gait speed and balance  Split stance squats x10 reps each side with posterior leg on pt's toe, with cues for proper technique and alignment, then progressing to a mini squat and then lifting contralateral leg for push off and pt having to control leg back into position, performing with intermittent UE support for balance  Agility ladder: performing with 2 feet at a time in each square while staying on toes, performed approx. 8 reps total, pt beginning with 10 seconds, then decr time to 7.5-8.5 seconds. Pt with improved technique with more practice. Pt needing one episode of min A due to almost tripping over her R foot   PATIENT EDUCATION: Education details: Continue HEP,  can try  working on mini lunges with contralateral leg raise for SLS/dynamic SLS Person educated: Patient Education method: Medical illustrator Education comprehension: verbalized understanding and returned demonstration  HOME EXERCISE PROGRAM: Access Code: [email or web address] URL: [email or web address] Date: 06/16/2023 Prepared by: Sherlie Ban  Exercises - Forward Step Down with Heel Tap and Rail Support  - 1 x daily - 7 x weekly - 3 sets - 10 reps - Side Step Down with Counter Support  - 1 x daily - 7 x weekly - 3 sets - 10 reps - Side Stepping with Resistance at Thighs and Counter Support  - 1 x daily - 7 x weekly - 3-5 reps - Marching with Resistance  - 1 x daily - 7 x weekly - 3 sets - Romberg Stance Eyes Closed on Foam Pad  - 1 x daily - 7 x weekly - 3 sets - 30 hold - Walking with Head Rotation  - 1 x daily - 7 x weekly - 3 sets - Single Leg Heel Raise with Unilateral Counter Support  - 1 x daily - 7 x weekly - 3 sets - 10 reps - Standing Hip Extension with Chair  - 1 x daily - 7 x weekly - 3 sets - 10 reps - Clamshell with Resistance  - 1 x daily - 7 x weekly - 2 sets - 10 reps - Mini Lunge  - 1 x daily - 5 x weekly - 1 sets - 10 reps - pushing off and lifting up contralateral leg  -Mini squat with heel raise   GOALS: Goals reviewed with patient? Yes   LONG TERM GOALS: Target date: 06/02/2023  Pt will be independent with final HEP in order to build upon functional gains made in therapy. Baseline:  Goal status: IN PROGRESS  2.  Pt will improve FGA to at least a 23/30 in order to demo decr fall risk Baseline: 19/30 (6/10); 24/30  Goal status: MET  3.  Pt will improve gait speed with no AD to at least 3.4 ft/sec in order to demo improved community mobility.  Baseline: 3.01 ft/s (6/10); 2.9 ft/s (7/8)  Goal status: IN PROGRESS  4.  Pt will perform stairs 12 stairs with reciprocal pattern without UE support with mod I in order to demo improvement with stairs.    Baseline: Alternating pattern, but does need railings at time for balance; requires single rail for balance w/alternating pattern  Goal status: NOT MET  5.  Pt will be able to jog at least 230' with supervision in order for return to jogging.  Baseline:  Goal status: IN PROGRESS   NEW LONG TERM GOALS FOR UPDATED POC:  Target date: 06/30/2023  Pt will be independent with final HEP in order to build upon functional gains made in therapy. Baseline:  Goal status: IN PROGRESS  2.  Pt will be able to jog at least 230' with supervision in order for return to jogging.  Baseline:  Goal status: IN PROGRESS  3.  Pt will improve gait speed with no AD to at least 3.4 ft/sec in order to demo improved community mobility.  Baseline: 3.01 ft/s (6/10); 2.9 ft/s (7/8)  Goal status: IN PROGRESS     ASSESSMENT:  CLINICAL IMPRESSION: Today's skilled session continued to focus on agility, speed, coordination, and push off activities for pt to return to jogging. Worked on trampoline exercises, resisted gait, and agility ladder tasks. Pt demonstrating improvements in balance today with SLS stability and push off activities. Pt  needing 2 episodes of min A today for balance during resisted gait and agility ladder due to almost tripping over her R foot. Will continue to work on agility ladder tasks and can try some jogging at next session. Pt in agreement with plan. Will continue to progress towards LTGs.    OBJECTIVE IMPAIRMENTS: Abnormal gait, decreased activity tolerance, decreased balance, difficulty walking, decreased ROM, decreased strength, impaired sensation, and pain.   ACTIVITY LIMITATIONS: stairs and locomotion level  PARTICIPATION LIMITATIONS:  jogging  PERSONAL FACTORS: Age, Time since onset of injury/illness/exacerbation, and 1-2 comorbidities: MOGAD, Anxiety  are also affecting patient's functional outcome.   REHAB POTENTIAL: Good  CLINICAL DECISION MAKING:  Stable/uncomplicated  EVALUATION COMPLEXITY: Low  PLAN:  PT FREQUENCY: 1x/week  PT DURATION: 8 weeks + 4 weeks (recert)   PLANNED INTERVENTIONS: Therapeutic exercises, Therapeutic activity, Neuromuscular re-education, Balance training, Gait training, Patient/Family education, Self Care, Stair training, Vestibular training, Orthotic/Fit training, DME instructions, Electrical stimulation, Manual therapy, and Re-evaluation  PLAN FOR NEXT SESSION:  Resisted retro gait, trampoline drills for coordination, quick feet with agility ladder  Blaze pod activities with speed. Try jogging, continue agility ladder  Continue to try skipping, stability on forefoot position. work on going down inclines and ankle strengthening.   RDLs, TM training (try to incr speed).     Drake Leach, PT, DPT  06/16/2023, 10:19 AM

## 2023-06-23 ENCOUNTER — Encounter: Payer: Self-pay | Admitting: Physical Therapy

## 2023-06-23 ENCOUNTER — Ambulatory Visit: Payer: 59 | Admitting: Physical Therapy

## 2023-06-23 DIAGNOSIS — M6281 Muscle weakness (generalized): Secondary | ICD-10-CM | POA: Diagnosis not present

## 2023-06-23 DIAGNOSIS — R2681 Unsteadiness on feet: Secondary | ICD-10-CM

## 2023-06-23 DIAGNOSIS — M21371 Foot drop, right foot: Secondary | ICD-10-CM | POA: Diagnosis not present

## 2023-06-23 DIAGNOSIS — R2689 Other abnormalities of gait and mobility: Secondary | ICD-10-CM | POA: Diagnosis not present

## 2023-06-23 NOTE — Therapy (Signed)
OUTPATIENT PHYSICAL THERAPY NEURO TREATMENT   Patient Name: Robin Mercer MRN: [id number] DOB:[birth year], 22 y.o., female Today's Date: 06/24/2023   PCP: Carin Hock, PA  REFERRING PROVIDER: Asa Lente, MD   END OF SESSION:  PT End of Session - 06/23/23 1534     Visit Number 12    Number of Visits 13   Recert   Date for PT Re-Evaluation 07/14/23   Recert (due to delay in scheduling)   Authorization Type [employer]    Progress Note Due on Visit 10    PT Start Time 1533    PT Stop Time 1620    PT Time Calculation (min) 47 min    Equipment Utilized During Treatment Other (comment);Gait belt   R foot up brace   Activity Tolerance Patient tolerated treatment well    Behavior During Therapy WFL for tasks assessed/performed                 Past Medical History:  Diagnosis Date   Seasonal allergies    History reviewed. No pertinent surgical history. 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   Right foot drop 04/01/2023   High risk medication use 04/01/2023   Vitamin D deficiency 04/01/2023   Pyuria 04/13/2022   Marijuana use 04/11/2022   Multiple sclerosis (HCC) 04/11/2022    ONSET DATE: 04/01/2023 (date of referral)   REFERRING DIAG: G37.81 (ICD-10-CM) - Myelin oligodendrocyte glycoprotein antibody disorder (MOGAD) M21.371 (ICD-10-CM) - Right foot drop   THERAPY DIAG:  Muscle weakness (generalized)  Other abnormalities of gait and mobility  Foot drop, right  Unsteadiness on feet  Rationale for Evaluation and Treatment: Rehabilitation  SUBJECTIVE:                                                                                                                                                                                             SUBJECTIVE STATEMENT: Clamshells with the red are getting too easy. Got her walking pad. Did about 20 minutes on 2.5 mph.  Feels like push off is getting easier. Went to [facility] today, and they are suggesting a carbon fiber AFOs, mainly fatigue. Will have to go back to get fitted.  Has been able to walk longer distances without getting fatigued as much.   Pt accompanied by: self  PERTINENT HISTORY: MOGAD, Anxiety  Per Dr. Epimenio Foot: Currently, she has mild right leg weakness and mild reduced gait. She is better than last year.  She could easily walk a mile and mostly keeps up with others. She has had no falls. She  still has right leg numbness and tingling, also better since the last visit. Bladder function has improved back to normal.  Vision is fine.   On Actemra once a week since June 2023   PAIN:  Are you having pain? No   PRECAUTIONS: None  WEIGHT BEARING RESTRICTIONS: No  FALLS: Has patient fallen in last 6 months? 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   PATIENT GOALS: Strengthen R leg, improve stairs, going back to running   OBJECTIVE:   DIAGNOSTIC FINDINGS: MRI of the brain 04/11/2022 showed multiple T2/FLAIR hyperintense foci in the periventricular, juxtacortical and deep white matter. None of the foci enhanced after contrast. 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  MRI of the lumbar spine 04/11/2022: There are T2 hyperintense foci adjacent to T10 and adjacent to T11-T12 (posterior). They do not enhance.  COGNITION: Overall cognitive status: Within functional limits for tasks assessed   SENSATION: Light touch: Impaired  and With RLE, pt with numbness/tingling in R thigh and decr light touch more distally.   COORDINATION: Heel to shin: WFL    POSTURE: No Significant postural limitations   LOWER EXTREMITY MMT:     MMT Right Eval Left Eval  Hip flexion 4 5  Hip extension    Hip abduction 3- 5  Hip adduction 5 5  Hip internal rotation    Hip external rotation    Knee flexion 4+ 5  Knee extension 4+ 4+  Ankle dorsiflexion 3- 5  Ankle plantarflexion    Ankle inversion    Ankle eversion    (Blank rows = not tested)  All tested in sitting    TODAY'S TREATMENT:    NMR  Jumping jacks 2 x 10 reps, during first set, pt needing to stop and reset for coordination, but during 2nd set, pt able to perform 10 in a row. Pt with improved ankle stability, no LOB, just impaired coordination  Split stance jacks for agility/coordination/push-off exercises, pt needing to use UE support for balance  Quick toe walking down hallway for push-off/fore feet stability, performed 4 x 20' with no UE support, with pt with decr speed  Attempted jogging in hallway, trying to incr speed with push off and getting coordinated UE movement, performed 6 x 20', on the 6th rep, pt needing mod A to prevent a fall due to pt not picking up her R foot enough and pt falling forwards. Pt able to prevent a fall and also needing to use the wall for stability  Obstacle course over 30' with large orange obstacles, 4 cones in a row, and 6" obstacles to work on SLS stability, foot clearance and hip/knee flexion with RLE, worked on down and back approx. 6-7 reps. It was easier to perform with pt stepping over with RLE, when performing with RLE as trailing limb, pt had to focus more on knee flexion as pt with tendency to hit the obstacle at time, more so when pt ws fatigued.  With RLE SLS on air ex and tapping LLE to forwards dot, lateral dot, and posterior dot with intermittent UE support x10 reps, then performed with LLE as stance leg on air ex x10 reps  In // bars: worked on jogging in place with push off on forefoot and  then worked on jogging with forward propulsion with pt doing better at end of session then when attempted in hallway    Therapeutic Exercise Pt reporting incr R DF/calf tightness after attempting jogging, performed below stretches and educated on importance of stretching daily instead of when it starts feeling stiff  Standing calf stretch against wall with RLE posteriorly 3 x 30-45 seconds  Downward dog to stretch hamstrings/calves, performed 4 reps, with pt able to hold for approx. 15-20 seconds with pt bending one knee and the other Pt reporting ankle felt better after performing   PATIENT EDUCATION: Education details: Continue HEP, can try working on jogging in place at home for forefoot stability/push off for return to jogging  Person educated: Patient Education method: Programmer, multimedia, Demonstration, Verbal cues, and Handouts Education comprehension: verbalized understanding and returned demonstration  HOME EXERCISE PROGRAM: Access Code: [email or web address] URL: [email or web address] Date: 06/16/2023 Prepared by: Sherlie Ban  Exercises - Forward Step Down with Heel Tap and Rail Support  - 1 x daily - 7 x weekly - 3 sets - 10 reps - Side Step Down with Counter Support  - 1 x daily - 7 x weekly - 3 sets - 10 reps - Side Stepping with Resistance at Thighs and Counter Support  - 1 x daily - 7 x weekly - 3-5 reps - Marching with Resistance  - 1 x daily - 7 x weekly - 3 sets - Romberg Stance Eyes Closed on Foam Pad  - 1 x daily - 7 x weekly - 3 sets - 30 hold - Walking with Head Rotation  - 1 x daily - 7 x weekly - 3 sets - Single Leg Heel Raise with Unilateral Counter Support  - 1 x daily - 7 x weekly - 3 sets - 10 reps - Standing Hip Extension with Chair  - 1 x daily - 7 x weekly - 3 sets - 10 reps - Clamshell with Resistance  - 1 x daily - 7 x weekly - 2 sets - 10 reps - Mini Lunge  - 1 x daily - 5 x weekly - 1 sets - 10 reps - pushing off and lifting up contralateral leg  -Mini squat with heel raise  -Jogging in place   GOALS: Goals reviewed with patient? Yes   LONG TERM GOALS: Target  date: 06/02/2023  Pt will be independent with final HEP in order to build upon functional gains made in therapy. Baseline:  Goal status: IN PROGRESS  2.  Pt will improve FGA to at least a 23/30 in order to demo decr fall risk Baseline: 19/30 (6/10); 24/30  Goal status: MET  3.  Pt will improve gait speed with no AD to at least 3.4 ft/sec in order to demo improved community mobility.  Baseline: 3.01 ft/s (6/10); 2.9 ft/s (7/8)  Goal status: IN PROGRESS  4.  Pt will perform stairs 12 stairs with reciprocal pattern without UE support with mod I in order to demo improvement with stairs.   Baseline: Alternating pattern, but does need railings at time for balance; requires single rail for balance w/alternating pattern  Goal status: NOT MET  5.  Pt will be able to jog at least 230' with supervision in order for return to jogging.  Baseline:  Goal status: IN PROGRESS   NEW LONG TERM GOALS FOR UPDATED POC:  Target date: 06/30/2023  Pt will be independent with final HEP in order to build upon functional gains made in therapy. Baseline:  Goal status: IN  PROGRESS  2.  Pt will be able to jog at least 230' with supervision in order for return to jogging.  Baseline:  Goal status: IN PROGRESS  3.  Pt will improve gait speed with no AD to at least 3.4 ft/sec in order to demo improved community mobility.  Baseline: 3.01 ft/s (6/10); 2.9 ft/s (7/8)  Goal status: IN PROGRESS     ASSESSMENT:  CLINICAL IMPRESSION: Today's skilled session continued to focus on agility, speed, coordination, push-off tasks and SLS stability for balance and working on return to jogging. Worked on activities with weight on fore foot and push off with jogging in place. With attempting to jog in hallway, pt with one instance of not being able to clear R foot and needing mod A from therapist to prevent a fall and pt also needing to use the wall. Pt with improvement of push off activities with jumping jacks and quick toe  walking today. Attempted jogging again at end of session in // bars and pt did demo improvements with cues to keep weight more on forefeet. Will continue to progress towards LTGs.     OBJECTIVE IMPAIRMENTS: Abnormal gait, decreased activity tolerance, decreased balance, difficulty walking, decreased ROM, decreased strength, impaired sensation, and pain.   ACTIVITY LIMITATIONS: stairs and locomotion level  PARTICIPATION LIMITATIONS:  jogging  PERSONAL FACTORS: Age, Time since onset of injury/illness/exacerbation, and 1-2 comorbidities: MOGAD, Anxiety  are also affecting patient's functional outcome.   REHAB POTENTIAL: Good  CLINICAL DECISION MAKING: Stable/uncomplicated  EVALUATION COMPLEXITY: Low  PLAN:  PT FREQUENCY: 1x/week  PT DURATION: 8 weeks + 4 weeks (recert)   PLANNED INTERVENTIONS: Therapeutic exercises, Therapeutic activity, Neuromuscular re-education, Balance training, Gait training, Patient/Family education, Self Care, Stair training, Vestibular training, Orthotic/Fit training, DME instructions, Electrical stimulation, Manual therapy, and Re-evaluation  PLAN FOR NEXT SESSION: check goals and perform re-cert.  Continue trying jogging, push off tasks, quick feet with agility ladder      Drake Leach, PT, DPT  06/24/2023, 10:36 AM

## 2023-06-30 ENCOUNTER — Encounter: Payer: Self-pay | Admitting: Physical Therapy

## 2023-06-30 ENCOUNTER — Ambulatory Visit: Payer: 59 | Attending: Neurology | Admitting: Physical Therapy

## 2023-06-30 DIAGNOSIS — M6281 Muscle weakness (generalized): Secondary | ICD-10-CM | POA: Insufficient documentation

## 2023-06-30 DIAGNOSIS — M21371 Foot drop, right foot: Secondary | ICD-10-CM | POA: Diagnosis not present

## 2023-06-30 DIAGNOSIS — R2681 Unsteadiness on feet: Secondary | ICD-10-CM | POA: Insufficient documentation

## 2023-06-30 DIAGNOSIS — R2689 Other abnormalities of gait and mobility: Secondary | ICD-10-CM | POA: Insufficient documentation

## 2023-06-30 DIAGNOSIS — R278 Other lack of coordination: Secondary | ICD-10-CM | POA: Diagnosis not present

## 2023-06-30 NOTE — Therapy (Signed)
OUTPATIENT PHYSICAL THERAPY NEURO TREATMENT/RE-CERT   Patient Name: Robin Mercer MRN: [id number] DOB:[birth year], 22 y.o., female Today's Date: 06/30/2023   PCP: Carin Hock, PA  REFERRING PROVIDER: Asa Lente, MD   END OF SESSION:  PT End of Session - 06/30/23 1022     Visit Number 13    Number of Visits 18   Recert   Date for PT Re-Evaluation 07/30/23   Recert (due to delay in scheduling)   Authorization Type [employer]    Progress Note Due on Visit 10    PT Start Time 1021    PT Stop Time 1100    PT Time Calculation (min) 39 min    Equipment Utilized During Treatment Other (comment);Gait belt   R foot up brace   Activity Tolerance Patient tolerated treatment well    Behavior During Therapy WFL for tasks assessed/performed                 Past Medical History:  Diagnosis Date   Seasonal allergies    History reviewed. No pertinent surgical history. 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   Right foot drop 04/01/2023   High risk medication use 04/01/2023   Vitamin D deficiency 04/01/2023   Pyuria 04/13/2022   Marijuana use 04/11/2022   Multiple sclerosis (HCC) 04/11/2022    ONSET DATE: 04/01/2023 (date of referral)   REFERRING DIAG: G37.81 (ICD-10-CM) - Myelin oligodendrocyte glycoprotein antibody disorder (MOGAD) M21.371 (ICD-10-CM) - Right foot drop   THERAPY DIAG:  Muscle weakness (generalized)  Other abnormalities of gait and mobility  Foot drop, right  Unsteadiness on feet  Rationale for Evaluation and Treatment: Rehabilitation  SUBJECTIVE:                                                                                                                                                                                             SUBJECTIVE STATEMENT: Nothing new since last week. Have tried jogging in place. Getting back to running is really  what is important to me.   Pt accompanied by: self  PERTINENT HISTORY: MOGAD, Anxiety  Per Dr. Epimenio Foot: Currently, she has mild right leg weakness and mild reduced gait. She is better than last year.  She could easily walk a mile and mostly keeps up with others. She has had no falls. She still has right leg numbness and tingling, also better since the last visit. Bladder function has improved back to normal.  Vision is fine.   On Actemra once a week since June 2023   PAIN:  Are you  having pain? No   PRECAUTIONS: None  WEIGHT BEARING RESTRICTIONS: No  FALLS: Has patient fallen in last 6 months? 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   PATIENT GOALS: Strengthen R leg, improve stairs, going back to running   OBJECTIVE:   DIAGNOSTIC FINDINGS: MRI of the brain 04/11/2022 showed multiple T2/FLAIR hyperintense foci in the periventricular, juxtacortical and deep white matter. None of the foci enhanced after contrast. 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  MRI of the lumbar spine 04/11/2022: There are T2 hyperintense foci adjacent to T10 and adjacent to T11-T12 (posterior). They do not enhance.  COGNITION: Overall cognitive status: Within functional limits for tasks assessed   SENSATION: Light touch: Impaired  and With RLE, pt with numbness/tingling in R thigh and decr light touch more distally.   COORDINATION: Heel to shin: WFL    POSTURE: No Significant postural limitations   LOWER EXTREMITY MMT:    MMT Right Eval Left Eval Right 06/30/23  Hip flexion 4 5 4+  Hip extension     Hip abduction 3- 5 4  Hip adduction 5 5   Hip internal rotation     Hip external rotation     Knee flexion 4+ 5 4+   Knee extension 4+ 4+ 5  Ankle dorsiflexion 3- 5 4+  Ankle plantarflexion     Ankle inversion     Ankle eversion     (Blank rows = not tested)  All tested in sitting    TODAY'S TREATMENT:   Therapeutic Activity: Gait speed with foot up brace: 10 seconds = 3.28 ft/sec With no foot up brace: 9.88 seconds = 3.32 ft/sec  SLS: LLE 30 seconds RLE 4 seconds   Therapeutic Exercise:  Standing hip ABD at countertop, attempted with keeping leg in a 90 degrees bent position and bringing it out to the side in a fire hydrant position, pt challenged by this, and unable to kept RLE in a bent position, pt able to perform on L side with RLE as stance leg, with pt trying to perform some without UE support for balance, performed x10 reps each side. Instead, performed straight leg hip ABD with cues for proper technique, this worked better for pt with better form, performed x10 reps each side. Pt also trying to perform a few reps without UE support when standing on RLE for balance.  Prone knee flexion, performed 10 reps on RLE, pt challenged with this and unable to perform with full ROM, cues to keep pelvis on the table  Single leg bridging 2 x 10 reps with RLE as stance leg, x10 reps with LLE as stance leg. Pt with improved ankle stability with this today (previously PT needing to help stabilize pt's R ankle) Single leg heel raises with RLE x15 reps, pt needing BUE support, cues to keep a soft bend in R knee  Supine hamstring curls with bilateral heels on red physioball x10 reps, then x10 reps with RLE only, cues for slowed and controlled    PATIENT EDUCATION: Education details: Updates to HEP, results of goals, continue working on jogging in place at home.  Person educated: Patient Education method: Explanation, Demonstration, Verbal cues, and Handouts Education comprehension: verbalized understanding and returned demonstration  HOME EXERCISE PROGRAM: Access  Code: [email or web address] URL:  [email or web address] Date: 06/30/2023 Prepared by: Sherlie Ban  Exercises - Forward Step Down with Heel Tap and Rail Support  - 1 x daily - 7 x weekly - 3 sets - 10 reps - Side Step Down with Counter Support  - 1 x daily - 7 x weekly - 3 sets - 10 reps - Side Stepping with Resistance at Thighs and Counter Support  - 1 x daily - 7 x weekly - 3-5 reps - Marching with Resistance  - 1 x daily - 7 x weekly - 3 sets - Romberg Stance Eyes Closed on Foam Pad  - 1 x daily - 7 x weekly - 3 sets - 30 hold - Walking with Head Rotation  - 1 x daily - 7 x weekly - 3 sets - Single Leg Heel Raise with Unilateral Counter Support  - 1 x daily - 7 x weekly - 3 sets - 10 reps - Standing Hip Extension with Chair  - 1 x daily - 7 x weekly - 3 sets - 10 reps - Clamshell with Resistance  - 1 x daily - 7 x weekly - 2 sets - 10 reps - Mini Lunge  - 1 x daily - 5 x weekly - 1 sets - 10 reps - Single Leg Bridge  - 1 x daily - 5 x weekly - 1-2 sets - 10 reps - Prone Knee Flexion (Mirrored)  - 1 x daily - 5 x weekly - 1-2 sets - 10 reps - Standing Hip Abduction with Counter Support  - 1 x daily - 5 x weekly - 1-2 sets - 10 reps -Jogging in place   GOALS: Goals reviewed with patient? Yes   NEW LONG TERM GOALS FOR UPDATED POC:  Target date: 06/30/2023  Pt will be independent with final HEP in order to build upon functional gains made in therapy. Baseline:  Goal status: IN PROGRESS  2.  Pt will be able to jog at least 230' with supervision in order for return to jogging.  Baseline: began to work on jogging in place and jogging in // bars  Goal status: IN PROGRESS  3.  Pt will improve gait speed with no AD to at least 3.4 ft/sec in order to demo improved community mobility.  Baseline: 3.01 ft/s (6/10); 2.9 ft/s (7/8)   3.28 ft/sec with foot up brace, 3.32 ft/sec with no foot up brace  Goal status: IN PROGRESS  LONG TERM GOALS: Target date: 07/28/2023 UPDATED/IN PROGRESS LTGS FOR RE-CERT:   Pt  will be independent with final HEP in order to build upon functional gains made in therapy. Baseline:  Goal status: IN PROGRESS  2.  Pt will be able to jog at least 230' with supervision in order for return to jogging.  Baseline: began to work on jogging in place and jogging in // bars  Goal status: IN PROGRESS  3.  Pt will improve gait speed with no AD to at least 3.4 ft/sec in order to demo improved community mobility.  Baseline: 3.01 ft/s (6/10); 2.9 ft/s (7/8)   3.28 ft/sec with foot up brace, 3.32 ft/sec with no foot up brace  Goal status: IN PROGRESS  4. Pt will improve SLS time on RLE to at least 8 seconds in order to demo improved SLS stability for balance  Baseline: 4 seconds  Goal status: NEW      ASSESSMENT:  CLINICAL IMPRESSION: Today's skilled session focused on assessing pt's LTGs. All 3 LTGs currently in  progress. Pt improved gait speed with foot up brace to 3.28 ft/sec and with no foot up brace to 3.32 ft/sec (previously was 2.9 ft/sec), indicating improved gait mechanics. At previous session, began starting to work on jogging in the hallway and // bars, LTG #2 in progress as well. Did have one instance when attempting to jog in hallway, where pt lost her balance and needing mod  A from therapist to recover. Updated HEP today for further strengthening. Pt with decr R knee flexion during standing tasks with RLE. Pt will continue to benefit from skilled PT to progress towards jogging, balance, and RLE strength/coordination. LTGs updated as appropriate.     OBJECTIVE IMPAIRMENTS: Abnormal gait, decreased activity tolerance, decreased balance, difficulty walking, decreased ROM, decreased strength, impaired sensation, and pain.   ACTIVITY LIMITATIONS: stairs and locomotion level  PARTICIPATION LIMITATIONS:  jogging  PERSONAL FACTORS: Age, Time since onset of injury/illness/exacerbation, and 1-2 comorbidities: MOGAD, Anxiety  are also affecting patient's functional  outcome.   REHAB POTENTIAL: Good  CLINICAL DECISION MAKING: Stable/uncomplicated  EVALUATION COMPLEXITY: Low  PLAN:  PT FREQUENCY: 1x/week  PT DURATION: 4 weeks   PLANNED INTERVENTIONS: Therapeutic exercises, Therapeutic activity, Neuromuscular re-education, Balance training, Gait training, Patient/Family education, Self Care, Stair training, Vestibular training, Orthotic/Fit training, DME instructions, Electrical stimulation, Manual therapy, and Re-evaluation  PLAN FOR NEXT SESSION: hip strengthening, SLS tasks  Continue trying jogging and jogging drills , push off tasks, quick feet with agility ladder      Drake Leach, PT, DPT  06/30/2023, 12:44 PM

## 2023-07-01 ENCOUNTER — Other Ambulatory Visit (HOSPITAL_COMMUNITY): Payer: Self-pay

## 2023-07-07 ENCOUNTER — Ambulatory Visit: Payer: 59 | Admitting: Physical Therapy

## 2023-07-07 DIAGNOSIS — M21371 Foot drop, right foot: Secondary | ICD-10-CM

## 2023-07-07 DIAGNOSIS — M6281 Muscle weakness (generalized): Secondary | ICD-10-CM | POA: Diagnosis not present

## 2023-07-07 DIAGNOSIS — R2689 Other abnormalities of gait and mobility: Secondary | ICD-10-CM

## 2023-07-07 DIAGNOSIS — R2681 Unsteadiness on feet: Secondary | ICD-10-CM | POA: Diagnosis not present

## 2023-07-07 DIAGNOSIS — R278 Other lack of coordination: Secondary | ICD-10-CM

## 2023-07-07 NOTE — Therapy (Signed)
OUTPATIENT PHYSICAL THERAPY NEURO TREATMENT   Patient Name: Robin Mercer MRN: [id number] DOB:2001-11-21, 22 y.o., female Today's Date: 07/07/2023   PCP: Carin Hock, PA  REFERRING PROVIDER: Asa Lente, MD   END OF SESSION:  PT End of Session - 07/07/23 1104     Visit Number 14    Number of Visits 18   Recert   Date for PT Re-Evaluation 07/30/23   Recert (due to delay in scheduling)   Authorization Type [employer]    Progress Note Due on Visit 10    PT Start Time 1103    PT Stop Time 1148    PT Time Calculation (min) 45 min    Equipment Utilized During Treatment Gait belt    Activity Tolerance Patient tolerated treatment well    Behavior During Therapy WFL for tasks assessed/performed                  Past Medical History:  Diagnosis Date   Seasonal allergies    No past surgical history on file. 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   High risk medication use 04/01/2023   Vitamin D deficiency 04/01/2023   Pyuria 04/13/2022   Marijuana use 04/11/2022   Multiple sclerosis (HCC) 04/11/2022    ONSET DATE: 04/01/2023 (date of referral)   REFERRING DIAG: G37.81 (ICD-10-CM) - Myelin oligodendrocyte glycoprotein antibody disorder (MOGAD) M21.371 (ICD-10-CM) - Right Mercer drop   THERAPY DIAG:  Other abnormalities of gait and mobility  Unsteadiness on feet  Other lack of coordination  Mercer drop, right  Rationale for Evaluation and Treatment: Rehabilitation  SUBJECTIVE:                                                                                                                                                                                             SUBJECTIVE STATEMENT: Pt presents without R Mercer-up brace, states she wanted to see how it went without it. Denies acute changes. The exercises Robin Mercer added last week are  hard.   Pt accompanied by: self  PERTINENT HISTORY: MOGAD, Anxiety  Per Dr. Epimenio Mercer: Currently, she has mild right leg weakness and mild reduced gait. She is better than last year.  She could easily walk a mile and mostly keeps up with others. She has had no falls. She still has right leg numbness and tingling, also better since the last visit. Bladder function has improved back to normal.  Vision is fine.   On Actemra once a week since June 2023   PAIN:  Are you having pain? Yes: NPRS scale: 2/10 Pain location: RLE Pain description: nerve pain     PRECAUTIONS: None  WEIGHT BEARING RESTRICTIONS: No  FALLS: Has patient fallen in last 6 months? 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   PATIENT GOALS: Strengthen R leg, improve stairs, going back to running   OBJECTIVE:   DIAGNOSTIC FINDINGS: MRI of the brain 04/11/2022 showed multiple T2/FLAIR hyperintense foci in the periventricular, juxtacortical and deep white matter. None of the foci enhanced after contrast. 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  MRI of the lumbar spine 04/11/2022: There are T2 hyperintense foci adjacent to T10 and adjacent to T11-T12 (posterior). They do not enhance.  COGNITION: Overall cognitive status: Within functional limits for tasks assessed   SENSATION: Light touch: Impaired  and With RLE, pt with numbness/tingling in R thigh and decr light touch more distally.   COORDINATION: Heel to shin: WFL    POSTURE: No Significant postural limitations   LOWER EXTREMITY MMT:    MMT Right Eval Left Eval Right 06/30/23  Hip flexion 4 5 4+  Hip extension     Hip abduction 3- 5 4  Hip adduction 5 5   Hip internal rotation      Hip external rotation     Knee flexion 4+ 5 4+  Knee extension 4+ 4+ 5  Ankle dorsiflexion 3- 5 4+  Ankle plantarflexion     Ankle inversion     Ankle eversion     (Blank rows = not tested)  All tested in sitting    TODAY'S TREATMENT:   Therapeutic Exercise:  Heel elevated squats for improved confidence on forefeet and improved quad strengthening. Pt performed well but did demonstrate occasional posterior LOB, but was able to correct independently.  Progressed to holding 5# Kbin goblet position, x15 reps. Pt reported feeling more stable when holding counterweight. RPE of 4-5/10 following activity.  Sumo squat to hamstring stretch, x12 reps, for improved hamstring mobility, hip mobility and confidence shifting weight onto front of feet to prime for jogging. No instability noted  At ballet bar for improved single leg stability, agility and return to jogging:   Single leg hops,x 50 per side w/BUE support. Pt unable to push off w/RLE well, states she feels as though her leg is going to give out on her. While hopping on RLE, pt's R ankle rolled laterally on her. Pt reports this happens often, ever since injuring it in high school in which she "badly rolled it".  Pt able to touch lateral malleolus on ground without pain. Added lateral heel wedge to R shoe to assist w/calcaneal position and pt reported feeling more stable in it, but it will not provide support w/calf-raised position. Encouraged pt to purchase simple ankle brace to provide lateral stability w/activities such as jogging, pt verbalized understanding.   Runners stretch, 3x2 minutes on R side, due to significant calf tightness/cramping w/activity.  Double calf raises, x20 reps, to warm-up for jogging and work on shifting weight to front of Mercer.  Alt toe tap to airex w/emphasis on speed, x20 per side. Pt has difficulty performing at a speed that maintains single leg stance due to incoordination and weakness of RLE. However, when pt can  perform w/double  limb stance, pt able to perform well.  In // bars, practiced return to jogging w/emphasis on push-off position. Encouraged pt to get into high lunge position, push off from back leg and then land w/same leg in front and repeat that motion until she is comfortable landing on single leg. Pt frequently rolling R ankle laterally despite having wedge in shoe, so reiterated importance of obtaining brace for that ankle or wearing Mercer-up.    PATIENT EDUCATION: Education details: Working on push-off position at home, purchasing ankle brace to prevent lateral rolling of R ankle  Person educated: Patient Education method: Programmer, multimedia, Facilities manager, and Verbal cues Education comprehension: verbalized understanding and returned demonstration  HOME EXERCISE PROGRAM: Access Code: [email or web address] URL: [email or web address] Date: 06/30/2023 Prepared by: Sherlie Ban  Exercises - Forward Step Down with Heel Tap and Rail Support  - 1 x daily - 7 x weekly - 3 sets - 10 reps - Side Step Down with Counter Support  - 1 x daily - 7 x weekly - 3 sets - 10 reps - Side Stepping with Resistance at Thighs and Counter Support  - 1 x daily - 7 x weekly - 3-5 reps - Marching with Resistance  - 1 x daily - 7 x weekly - 3 sets - Romberg Stance Eyes Closed on Foam Pad  - 1 x daily - 7 x weekly - 3 sets - 30 hold - Walking with Head Rotation  - 1 x daily - 7 x weekly - 3 sets - Single Leg Heel Raise with Unilateral Counter Support  - 1 x daily - 7 x weekly - 3 sets - 10 reps - Standing Hip Extension with Chair  - 1 x daily - 7 x weekly - 3 sets - 10 reps - Clamshell with Resistance  - 1 x daily - 7 x weekly - 2 sets - 10 reps - Mini Lunge  - 1 x daily - 5 x weekly - 1 sets - 10 reps - Single Leg Bridge  - 1 x daily - 5 x weekly - 1-2 sets - 10 reps - Prone Knee Flexion (Mirrored)  - 1 x daily - 5 x weekly - 1-2 sets - 10 reps - Standing Hip Abduction with Counter Support  - 1 x daily - 5 x  weekly - 1-2 sets - 10 reps -Jogging in place   GOALS: Goals reviewed with patient? Yes   NEW LONG TERM GOALS FOR UPDATED POC:  Target date: 06/30/2023  Pt will be independent with final HEP in order to build upon functional gains made in therapy. Baseline:  Goal status: IN PROGRESS  2.  Pt will be able to jog at least 230' with supervision in order for return to jogging.  Baseline: began to work on jogging in place and jogging in // bars  Goal status: IN PROGRESS  3.  Pt will improve gait speed with no AD to at least 3.4 ft/sec in order to demo improved community mobility.  Baseline: 3.01 ft/s (6/10); 2.9 ft/s (7/8)   3.28 ft/sec with Mercer up brace, 3.32 ft/sec with no Mercer up brace  Goal status: IN PROGRESS  LONG TERM GOALS: Target date: 07/28/2023 UPDATED/IN PROGRESS LTGS FOR RE-CERT:   Pt will be independent with final HEP in order to build upon functional gains made in therapy. Baseline:  Goal status: IN PROGRESS  2.  Pt will be able to jog at least 230' with supervision in order for return to jogging.  Baseline: began to  work on jogging in place and jogging in // bars  Goal status: IN PROGRESS  3.  Pt will improve gait speed with no AD to at least 3.4 ft/sec in order to demo improved community mobility.  Baseline: 3.01 ft/s (6/10); 2.9 ft/s (7/8)   3.28 ft/sec with Mercer up brace, 3.32 ft/sec with no Mercer up brace  Goal status: IN PROGRESS  4. Pt will improve SLS time on RLE to at least 8 seconds in order to demo improved SLS stability for balance  Baseline: 4 seconds  Goal status: NEW      ASSESSMENT:  CLINICAL IMPRESSION: Emphasis of skilled PT session on single leg stability, weightbearing comfort on forefeet to prime for jogging and functional mobility. Pt reports feeling unstable when weight is shifted on her toes, so worked on elevated squats and heel raises to improve strength and comfort in this position. Pt extremely limited by severe lateral ankle  instability on R side, frequently rolling her ankle throughout session to the point of her lateral malleolus touching the ground without discomfort. Encouraged pt to either continue wearing her Mercer-up brace or purchase lateral stability brace to provide support w/higher level activity. Continue POC.     OBJECTIVE IMPAIRMENTS: Abnormal gait, decreased activity tolerance, decreased balance, difficulty walking, decreased ROM, decreased strength, impaired sensation, and pain.   ACTIVITY LIMITATIONS: stairs and locomotion level  PARTICIPATION LIMITATIONS:  jogging  PERSONAL FACTORS: Age, Time since onset of injury/illness/exacerbation, and 1-2 comorbidities: MOGAD, Anxiety  are also affecting patient's functional outcome.   REHAB POTENTIAL: Good  CLINICAL DECISION MAKING: Stable/uncomplicated  EVALUATION COMPLEXITY: Low  PLAN:  PT FREQUENCY: 1x/week  PT DURATION: 4 weeks   PLANNED INTERVENTIONS: Therapeutic exercises, Therapeutic activity, Neuromuscular re-education, Balance training, Gait training, Patient/Family education, Self Care, Stair training, Vestibular training, Orthotic/Fit training, DME instructions, Electrical stimulation, Manual therapy, and Re-evaluation  PLAN FOR NEXT SESSION: hip strengthening, SLS tasks  Continue trying jogging and jogging drills , push off tasks, quick feet with agility ladder, did she get ankle brace? Sled pushes     [employer], PT, DPT  07/07/2023, 11:59 AM

## 2023-07-14 ENCOUNTER — Ambulatory Visit: Payer: 59 | Admitting: Physical Therapy

## 2023-07-14 DIAGNOSIS — R278 Other lack of coordination: Secondary | ICD-10-CM

## 2023-07-14 DIAGNOSIS — R2681 Unsteadiness on feet: Secondary | ICD-10-CM | POA: Diagnosis not present

## 2023-07-14 DIAGNOSIS — R2689 Other abnormalities of gait and mobility: Secondary | ICD-10-CM

## 2023-07-14 DIAGNOSIS — M6281 Muscle weakness (generalized): Secondary | ICD-10-CM

## 2023-07-14 DIAGNOSIS — M21371 Foot drop, right foot: Secondary | ICD-10-CM | POA: Diagnosis not present

## 2023-07-14 NOTE — Therapy (Signed)
OUTPATIENT PHYSICAL THERAPY NEURO TREATMENT   Patient Name: Robin Mercer MRN: [id number] DOB:July 16, 2001, 22 y.o., female Today's Date: 07/14/2023   PCP: Carin Hock, PA  REFERRING PROVIDER: Asa Lente, MD   END OF SESSION:  PT End of Session - 07/14/23 0936     Visit Number 15    Number of Visits 18   Recert   Date for PT Re-Evaluation 07/30/23   Recert (due to delay in scheduling)   Authorization Type [employer]    Progress Note Due on Visit 10    PT Start Time 0935    PT Stop Time 1014    PT Time Calculation (min) 39 min    Equipment Utilized During Treatment Gait belt    Activity Tolerance Patient tolerated treatment well    Behavior During Therapy WFL for tasks assessed/performed                  Past Medical History:  Diagnosis Date   Seasonal allergies    No past surgical history on file. 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   Right foot drop 04/01/2023   High risk medication use 04/01/2023   Vitamin D deficiency 04/01/2023   Pyuria 04/13/2022   Marijuana use 04/11/2022   Multiple sclerosis (HCC) 04/11/2022    ONSET DATE: 04/01/2023 (date of referral)   REFERRING DIAG: G37.81 (ICD-10-CM) - Myelin oligodendrocyte glycoprotein antibody disorder (MOGAD) M21.371 (ICD-10-CM) - Right foot drop   THERAPY DIAG:  Other abnormalities of gait and mobility  Unsteadiness on feet  Other lack of coordination  Foot drop, right  Muscle weakness (generalized)  Rationale for Evaluation and Treatment: Rehabilitation  SUBJECTIVE:                                                                                                                                                                                             SUBJECTIVE STATEMENT: Pt presents with R foot-up brace, states she is tired today because she worked all weekend. Accidentally threw her  heel wedge into the washing machine, so she has not been wearing it. No falls. Wanting to work on her hips today.   Pt accompanied by: self  PERTINENT HISTORY: MOGAD, Anxiety  Per Dr. Epimenio Foot: Currently, she has mild right leg weakness and mild reduced gait. She is better than last year.  She could easily walk a mile and mostly keeps up with others. She has had no falls. She still has right leg numbness and tingling, also better since the last visit. Bladder function has improved back to normal.  Vision is fine.   On Actemra once a week since June 2023   PAIN:  Are you having pain? Yes: NPRS scale: 2/10 Pain location: RLE Pain description: nerve pain     PRECAUTIONS: None  WEIGHT BEARING RESTRICTIONS: No  FALLS: Has patient fallen in last 6 months? 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   PATIENT GOALS: Strengthen R leg, improve stairs, going back to running   OBJECTIVE:   DIAGNOSTIC FINDINGS: MRI of the brain 04/11/2022 showed multiple T2/FLAIR hyperintense foci in the periventricular, juxtacortical and deep white matter. None of the foci enhanced after contrast. 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  MRI of the lumbar spine 04/11/2022: There are T2 hyperintense foci adjacent to T10 and adjacent to T11-T12 (posterior). They do not enhance.  COGNITION: Overall cognitive status: Within functional limits for tasks assessed   SENSATION: Light touch: Impaired  and With RLE, pt with numbness/tingling in R thigh and decr light touch more distally.   COORDINATION: Heel to shin: WFL    POSTURE: No Significant postural limitations   LOWER EXTREMITY MMT:    MMT Right Eval Left Eval Right 06/30/23   Hip flexion 4 5 4+  Hip extension     Hip abduction 3- 5 4  Hip adduction 5 5   Hip internal rotation     Hip external rotation     Knee flexion 4+ 5 4+  Knee extension 4+ 4+ 5  Ankle dorsiflexion 3- 5 4+  Ankle plantarflexion     Ankle inversion     Ankle eversion     (Blank rows = not tested)  All tested in sitting    TODAY'S TREATMENT:   Therapeutic Exercise  At ballet bar for improved hip abduction strength, single legs stability and return to running:  Standing hip abduction w/BUE support, x10 per side. Min cues to avoid lateral lean compensation when performing on R side Alt curtsy lunges w/BUE support, x12 per side. Min cues for wider BOS for proper form.  In high lunge position w/back heel off ground, heel raises on front foot w/BUE support, x18 reps per side. Increased difficulty on RLE >LLE  4 Blaze pods on random reach setting for improved single leg stability, ankle stability, hip abduction strength and LE coordination.  Performed on 2 minute intervals with 2 minute rest periods.  Pt requires SBA guarding and BUE support. Round 1:  Right foot on green dynadisc w/4 pods placed in semi-circle on L side and only tapping w/L foot.  50 hits. Round 2:  Same setup w/pods placed further away from LLE.  54 hits. RPE of 5/10 With right foot on green dynadisc, worked on tall lunge to L high knee transition for improved weight acceptance on RLE w/push-off and single leg stability for return to running. RUE support throughout.  Progressed to L foot on dynadisc, x15 reps. Pt reported feeling more difficult on this side due to placing more weight on LLE > RLE, so cued to shift weight to RLE for improved stability.  Standing hip abduction on LLE w/RLE on green dynadisc, x15 reps w/BUE support.   PATIENT EDUCATION: Education details: Reiterated importance of purchasing an ankle brace for lateral stability. Where to purchase dynadisc  Person educated:  Patient Education method:  Explanation, Demonstration, and Verbal cues Education comprehension: verbalized understanding and returned demonstration  HOME EXERCISE PROGRAM: Access Code: [email or web address] URL: [email or web address] Date: 06/30/2023 Prepared by: Sherlie Ban  Exercises - Forward Step Down with Heel Tap and Rail Support  - 1 x daily - 7 x weekly - 3 sets - 10 reps - Side Step Down with Counter Support  - 1 x daily - 7 x weekly - 3 sets - 10 reps - Side Stepping with Resistance at Thighs and Counter Support  - 1 x daily - 7 x weekly - 3-5 reps - Marching with Resistance  - 1 x daily - 7 x weekly - 3 sets - Romberg Stance Eyes Closed on Foam Pad  - 1 x daily - 7 x weekly - 3 sets - 30 hold - Walking with Head Rotation  - 1 x daily - 7 x weekly - 3 sets - Single Leg Heel Raise with Unilateral Counter Support  - 1 x daily - 7 x weekly - 3 sets - 10 reps - Standing Hip Extension with Chair  - 1 x daily - 7 x weekly - 3 sets - 10 reps - Clamshell with Resistance  - 1 x daily - 7 x weekly - 2 sets - 10 reps - Mini Lunge  - 1 x daily - 5 x weekly - 1 sets - 10 reps - Single Leg Bridge  - 1 x daily - 5 x weekly - 1-2 sets - 10 reps - Prone Knee Flexion (Mirrored)  - 1 x daily - 5 x weekly - 1-2 sets - 10 reps - Standing Hip Abduction with Counter Support  - 1 x daily - 5 x weekly - 1-2 sets - 10 reps -Jogging in place   GOALS: Goals reviewed with patient? Yes   NEW LONG TERM GOALS FOR UPDATED POC:  Target date: 06/30/2023  Pt will be independent with final HEP in order to build upon functional gains made in therapy. Baseline:  Goal status: IN PROGRESS  2.  Pt will be able to jog at least 230' with supervision in order for return to jogging.  Baseline: began to work on jogging in place and jogging in // bars  Goal status: IN PROGRESS  3.  Pt will improve gait speed with no AD to at least 3.4 ft/sec in order to demo improved community mobility.  Baseline: 3.01 ft/s (6/10); 2.9 ft/s (7/8)    3.28 ft/sec with foot up brace, 3.32 ft/sec with no foot up brace  Goal status: IN PROGRESS  LONG TERM GOALS: Target date: 07/28/2023 UPDATED/IN PROGRESS LTGS FOR RE-CERT:   Pt will be independent with final HEP in order to build upon functional gains made in therapy. Baseline:  Goal status: IN PROGRESS  2.  Pt will be able to jog at least 230' with supervision in order for return to jogging.  Baseline: began to work on jogging in place and jogging in // bars  Goal status: IN PROGRESS  3.  Pt will improve gait speed with no AD to at least 3.4 ft/sec in order to demo improved community mobility.  Baseline: 3.01 ft/s (6/10); 2.9 ft/s (7/8)   3.28 ft/sec with foot up brace, 3.32 ft/sec with no foot up brace  Goal status: IN PROGRESS  4. Pt will improve SLS time on RLE to at least 8 seconds in order to demo improved SLS stability for balance  Baseline: 4 seconds  Goal status: NEW  ASSESSMENT:  CLINICAL IMPRESSION: Emphasis of skilled PT session on single leg stability, hip abduction strength, ankle stability and functional stability in tall lunge position to facilitate return to running. Pt tolerated session well w/no lateral instability of ankle noted this session. Noted pt maintaining weight in LLE > RLE, so cued to equal weight shift as running is a unilateral sport. Pt most challenged by calf raises in high lunge position due to lateral ankle instability and calf weakness. Pt requesting to add more visits through September so added appointments today. Continue POC.     OBJECTIVE IMPAIRMENTS: Abnormal gait, decreased activity tolerance, decreased balance, difficulty walking, decreased ROM, decreased strength, impaired sensation, and pain.   ACTIVITY LIMITATIONS: stairs and locomotion level  PARTICIPATION LIMITATIONS:  jogging  PERSONAL FACTORS: Age, Time since onset of injury/illness/exacerbation, and 1-2 comorbidities: MOGAD, Anxiety  are also affecting patient's  functional outcome.   REHAB POTENTIAL: Good  CLINICAL DECISION MAKING: Stable/uncomplicated  EVALUATION COMPLEXITY: Low  PLAN:  PT FREQUENCY: 1x/week  PT DURATION: 4 weeks   PLANNED INTERVENTIONS: Therapeutic exercises, Therapeutic activity, Neuromuscular re-education, Balance training, Gait training, Patient/Family education, Self Care, Stair training, Vestibular training, Orthotic/Fit training, DME instructions, Electrical stimulation, Manual therapy, and Re-evaluation  PLAN FOR NEXT SESSION: hip strengthening, SLS tasks  Continue trying jogging and jogging drills , push off tasks, quick feet with agility ladder, did she get ankle brace? Sled pushes  **pt wants to add appointments until October      Dwanda Tufano E Kaleab Frasier, PT, DPT  07/14/2023, 10:15 AM

## 2023-07-21 ENCOUNTER — Encounter: Payer: Self-pay | Admitting: Physical Therapy

## 2023-07-21 ENCOUNTER — Ambulatory Visit: Payer: 59 | Admitting: Physical Therapy

## 2023-07-21 DIAGNOSIS — M21371 Foot drop, right foot: Secondary | ICD-10-CM | POA: Diagnosis not present

## 2023-07-21 DIAGNOSIS — R2689 Other abnormalities of gait and mobility: Secondary | ICD-10-CM | POA: Diagnosis not present

## 2023-07-21 DIAGNOSIS — R2681 Unsteadiness on feet: Secondary | ICD-10-CM | POA: Diagnosis not present

## 2023-07-21 DIAGNOSIS — R278 Other lack of coordination: Secondary | ICD-10-CM | POA: Diagnosis not present

## 2023-07-21 DIAGNOSIS — M6281 Muscle weakness (generalized): Secondary | ICD-10-CM | POA: Diagnosis not present

## 2023-07-21 NOTE — Therapy (Signed)
OUTPATIENT PHYSICAL THERAPY NEURO TREATMENT   Patient Name: Robin Mercer MRN: [id number] DOB:Jul 27, 2001, 22 y.o., female Today's Date: 07/21/2023   PCP: Carin Hock, PA  REFERRING PROVIDER: Asa Lente, MD   END OF SESSION:  PT End of Session - 07/21/23 1452     Visit Number 16    Number of Visits 18   Recert   Date for PT Re-Evaluation 07/30/23   Recert (due to delay in scheduling)   Authorization Type [employer]    Progress Note Due on Visit 10    PT Start Time 1450    PT Stop Time 1530    PT Time Calculation (min) 40 min    Equipment Utilized During Treatment Gait belt    Activity Tolerance Patient tolerated treatment well    Behavior During Therapy WFL for tasks assessed/performed                  Past Medical History:  Diagnosis Date   Seasonal allergies    History reviewed. No pertinent surgical history. 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   Right foot drop 04/01/2023   High risk medication use 04/01/2023   Vitamin D deficiency 04/01/2023   Pyuria 04/13/2022   Marijuana use 04/11/2022   Multiple sclerosis (HCC) 04/11/2022    ONSET DATE: 04/01/2023 (date of referral)   REFERRING DIAG: G37.81 (ICD-10-CM) - Myelin oligodendrocyte glycoprotein antibody disorder (MOGAD) M21.371 (ICD-10-CM) - Right foot drop   THERAPY DIAG:  Unsteadiness on feet  Other abnormalities of gait and mobility  Other lack of coordination  Rationale for Evaluation and Treatment: Rehabilitation  SUBJECTIVE:                                                                                                                                                                                             SUBJECTIVE STATEMENT: Picked up her R AFO today. Haven't had the chance to wear it yet. Also picked up an ankle brace today. Reports it feels a little weird wearing it. [place] No falls, no trips. Was wearing sandals most of the time.   Pt accompanied by: self  PERTINENT HISTORY: MOGAD, Anxiety  Per Dr. Epimenio Foot: Currently, she has mild right leg weakness and mild reduced gait. She is better than last year.  She could easily walk a mile and mostly keeps up with others. She has had no falls. She still has right leg numbness and tingling, also better since the last visit. Bladder function has improved back to normal.  Vision is fine.   On Actemra once a week since June 2023   PAIN:  Are you having pain? Yes: NPRS scale: 2/10 Pain location: RLE Pain description: nerve pain     PRECAUTIONS: None  WEIGHT BEARING RESTRICTIONS: No  FALLS: Has patient fallen in last 6 months? 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   PATIENT GOALS: Strengthen R leg, improve stairs, going back to running   OBJECTIVE:   DIAGNOSTIC FINDINGS: MRI of the brain 04/11/2022 showed multiple T2/FLAIR hyperintense foci in the periventricular, juxtacortical and deep white matter. None of the foci enhanced after contrast. 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  MRI of the lumbar spine 04/11/2022: There are T2 hyperintense foci adjacent to T10 and adjacent to T11-T12 (posterior). They do not enhance.  COGNITION: Overall cognitive status: Within functional limits for tasks assessed   SENSATION: Light touch: Impaired  and With RLE, pt with numbness/tingling in R thigh and decr light touch more distally.   COORDINATION: Heel to shin: WFL    POSTURE: No Significant postural limitations   LOWER EXTREMITY MMT:    MMT Right Eval Left Eval Right 06/30/23  Hip flexion 4 5 4+  Hip  extension     Hip abduction 3- 5 4  Hip adduction 5 5   Hip internal rotation     Hip external rotation     Knee flexion 4+ 5 4+  Knee extension 4+ 4+ 5  Ankle dorsiflexion 3- 5 4+  Ankle plantarflexion     Ankle inversion     Ankle eversion     (Blank rows = not tested)  All tested in sitting    TODAY'S TREATMENT:   Therapeutic Activity Pt brought in her R PLS [place] AFO that she picked up from [employer] today, wore brace during session with pt reporting no discomfort. Educated on gradual wear schedule for AFO, beginning with 1-2 hours on and a couple hours off and then gradually incr time worn until she can wear it for a whole day. Pt verbalized understanding.    STAIRS:  Level of Assistance: Modified independence and SBA  Stair Negotiation Technique: Alternating Pattern  with Bilateral Rails  Number of Stairs: 4 steps x 6 reps total = 24 steps   Height of Stairs: 6"  Comments: Pt initially demonstrating how she goes up the stairs with alternating pattern (just having her tip toes on the step), educated to focus on placing full foot on the step for balance when ascending with pt reporting feeling more balance this way. When descending, pt focusing too heavily on heel toe pattern and slower to perform when descending. Discussed instead focusing on getting the full foot on the step and getting foot out enough to clear step vs. Thinking about heel toe pattern when descending, pt also reporting that this felt better and more natural when performing steps.   GAIT: Gait pattern: step through pattern, decreased hip/knee flexion- Right, and lateral hip instability Distance walked: Clinic distances indoors, plus 500' over grass outdoors  Assistive device utilized: None Level of assistance: Modified independence and SBA Comments: Pt wearing R AFO, pt initially feeling uncoordinated when wearing brace, but did improve fluidity of gait the more pt wore AFO during session. Pt also  with improved  ankle stability with brace and no episodes of R ankle rolling today when on unlevel surfaces or doing SLS tasks.  Worked on ambulating on grass outdoors for compliant surfaces, did need one episode of CGA for balance, otherwise pt demonstrated good balance with use of R AFO    Therapeutic Exercise  At ballet bar: Hamstring curls/buttock kicks x10 reps each leg (pt with less AROM and difficulty to perform with RLE), performed an additional x10 reps each side gradually incr speed of movement for push off, BUE support needed at bars  Marching in place, slow speed and then incr speed, 2 x 10 reps each side, with single UE support, decr ROM with RLE and more difficulty keeping balance on this side  With thicker blue foam: Sit <> stands with no UE support with heel raise at the top, 2 x 10 reps, focused on incr speed/power with 2nd set with pt immediately having to stand up after sitting down  Working on SLS and hip strengthening: 2 x 10 reps hip ABD (performed each side) with leg at 45 degrees, initial cues for alignment and keeping a soft bend in stance leg for hip ABD activation  At bottom of staircase having one foot on foam and 2nd foot posteriorly on level ground in stride stance, then pushing off back leg holding SLS for a few seconds and then tapping 2nd step and then bringing leg back with control to stride stance, performing x12 reps each side, pt needing to use intermittent UE support on R side for balance  Alternating SLS taps to 6" step at x10 reps, trying to incr speed of performing   PATIENT EDUCATION: Education details: AFO wear schedule, stair training, wearing R ankle brace when not wearing R AFO  Person educated: Patient Education method: Explanation, Demonstration, and Verbal cues Education comprehension: verbalized understanding and returned demonstration  HOME EXERCISE PROGRAM: Access Code: [email or web address] URL: [email or web address] Date: 06/30/2023 Prepared by: Sherlie Ban  Exercises - Forward Step Down with Heel Tap and Rail Support  - 1 x daily - 7 x weekly - 3 sets - 10 reps - Side Step Down with Counter Support  - 1 x daily - 7 x weekly - 3 sets - 10 reps - Side Stepping with Resistance at Thighs and Counter Support  - 1 x daily - 7 x weekly - 3-5 reps - Marching with Resistance  - 1 x daily - 7 x weekly - 3 sets - Romberg Stance Eyes Closed on Foam Pad  - 1 x daily - 7 x weekly - 3 sets - 30 hold - Walking with Head Rotation  - 1 x daily - 7 x weekly - 3 sets - Single Leg Heel Raise with Unilateral Counter Support  - 1 x daily - 7 x weekly - 3 sets - 10 reps - Standing Hip Extension with Chair  - 1 x daily - 7 x weekly - 3 sets - 10 reps - Clamshell with Resistance  - 1 x daily - 7 x weekly - 2 sets - 10 reps - Mini Lunge  - 1 x daily - 5 x weekly - 1 sets - 10 reps - Single Leg Bridge  - 1 x daily - 5 x weekly - 1-2 sets - 10 reps - Prone Knee Flexion (Mirrored)  - 1 x daily - 5 x weekly - 1-2 sets - 10 reps - Standing Hip Abduction with Counter Support  - 1 x daily - 5  x weekly - 1-2 sets - 10 reps -Jogging in place   GOALS: Goals reviewed with patient? Yes   NEW LONG TERM GOALS FOR UPDATED POC:  Target date: 06/30/2023  Pt will be independent with final HEP in order to build upon functional gains made in therapy. Baseline:  Goal status: IN PROGRESS  2.  Pt will be able to jog at least 230' with supervision in order for return to jogging.  Baseline: began to work on jogging in place and jogging in // bars  Goal status: IN PROGRESS  3.  Pt will improve gait speed with no AD to at least 3.4 ft/sec in order to demo improved community mobility.  Baseline: 3.01 ft/s (6/10); 2.9 ft/s (7/8)   3.28 ft/sec with foot up brace, 3.32 ft/sec with no foot up brace  Goal status: IN PROGRESS  LONG TERM GOALS: Target date: 07/28/2023 UPDATED/IN PROGRESS LTGS FOR RE-CERT:   Pt will be independent with final HEP in order to build upon functional  gains made in therapy. Baseline:  Goal status: IN PROGRESS  2.  Pt will be able to jog at least 230' with supervision in order for return to jogging.  Baseline: began to work on jogging in place and jogging in // bars  Goal status: IN PROGRESS  3.  Pt will improve gait speed with no AD to at least 3.4 ft/sec in order to demo improved community mobility.  Baseline: 3.01 ft/s (6/10); 2.9 ft/s (7/8)   3.28 ft/sec with foot up brace, 3.32 ft/sec with no foot up brace  Goal status: IN PROGRESS  4. Pt will improve SLS time on RLE to at least 8 seconds in order to demo improved SLS stability for balance  Baseline: 4 seconds  Goal status: NEW      ASSESSMENT:  CLINICAL IMPRESSION: Pt received her R [place] PLS AFO today, so pt wore it during session. Pt demonstrating improved RLE foot clearance and also ankle stability when performing balance and gait tasks on unlevel surfaces. Educated on wear schedule. Worked on stair training and balance strategies for controlled SLS and hip ABD strengthening for return to running. Pt also purchased an ankle brace as well. Pt to bring to next session along with R AFO. Continue POC.     OBJECTIVE IMPAIRMENTS: Abnormal gait, decreased activity tolerance, decreased balance, difficulty walking, decreased ROM, decreased strength, impaired sensation, and pain.   ACTIVITY LIMITATIONS: stairs and locomotion level  PARTICIPATION LIMITATIONS:  jogging  PERSONAL FACTORS: Age, Time since onset of injury/illness/exacerbation, and 1-2 comorbidities: MOGAD, Anxiety  are also affecting patient's functional outcome.   REHAB POTENTIAL: Good  CLINICAL DECISION MAKING: Stable/uncomplicated  EVALUATION COMPLEXITY: Low  PLAN:  PT FREQUENCY: 1x/week  PT DURATION: 4 weeks   PLANNED INTERVENTIONS: Therapeutic exercises, Therapeutic activity, Neuromuscular re-education, Balance training, Gait training, Patient/Family education, Self Care, Stair training, Vestibular  training, Orthotic/Fit training, DME instructions, Electrical stimulation, Manual therapy, and Re-evaluation  PLAN FOR NEXT SESSION: will need a re-cert next visit to cover until October   hip strengthening, SLS tasks  Continue trying jogging and jogging drills , push off tasks, quick feet with agility ladder, did she get ankle brace? Sled pushes  **pt wants to add appointments until October      Drake Leach, PT, DPT  07/21/2023, 9:35 PM

## 2023-07-24 ENCOUNTER — Telehealth: Payer: Self-pay | Admitting: Neurology

## 2023-07-24 NOTE — Telephone Encounter (Signed)
[facility]) did not receive Standard Written Order form regarding AFO with fax.

## 2023-07-24 NOTE — Telephone Encounter (Signed)
[facility] and they are faxing over form to direct pod fax machine [phone]

## 2023-07-30 ENCOUNTER — Ambulatory Visit: Payer: 59 | Admitting: Physical Therapy

## 2023-08-04 ENCOUNTER — Ambulatory Visit: Payer: 59 | Attending: Neurology | Admitting: Physical Therapy

## 2023-08-04 ENCOUNTER — Encounter: Payer: Self-pay | Admitting: Physical Therapy

## 2023-08-04 DIAGNOSIS — M6281 Muscle weakness (generalized): Secondary | ICD-10-CM | POA: Insufficient documentation

## 2023-08-04 DIAGNOSIS — R2689 Other abnormalities of gait and mobility: Secondary | ICD-10-CM | POA: Insufficient documentation

## 2023-08-04 DIAGNOSIS — R2681 Unsteadiness on feet: Secondary | ICD-10-CM | POA: Diagnosis not present

## 2023-08-04 DIAGNOSIS — M21371 Foot drop, right foot: Secondary | ICD-10-CM | POA: Diagnosis not present

## 2023-08-04 DIAGNOSIS — R278 Other lack of coordination: Secondary | ICD-10-CM | POA: Diagnosis not present

## 2023-08-04 NOTE — Therapy (Signed)
OUTPATIENT PHYSICAL THERAPY NEURO TREATMENT/RE-CERT   Patient Name: Robin Mercer MRN: [id number] DOB:09-22-2001, 22 y.o., female Today's Date: 08/04/2023   PCP: Carin Hock, PA  REFERRING PROVIDER: Asa Lente, MD   END OF SESSION:  PT End of Session - 08/04/23 0934     Visit Number 17    Number of Visits 23   Recert   Date for PT Re-Evaluation 10/03/23   per re-cert on 07/30/61   Authorization Type [employer]    Progress Note Due on Visit 10    PT Start Time 0933    PT Stop Time 1016    PT Time Calculation (min) 43 min    Equipment Utilized During Treatment Gait belt    Activity Tolerance Patient tolerated treatment well    Behavior During Therapy WFL for tasks assessed/performed                  Past Medical History:  Diagnosis Date   Seasonal allergies    History reviewed. No pertinent surgical history. 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   Right foot drop 04/01/2023   High risk medication use 04/01/2023   Vitamin D deficiency 04/01/2023   Pyuria 04/13/2022   Marijuana use 04/11/2022   Multiple sclerosis (HCC) 04/11/2022    ONSET DATE: 04/01/2023 (date of referral)   REFERRING DIAG: G37.81 (ICD-10-CM) - Myelin oligodendrocyte glycoprotein antibody disorder (MOGAD) M21.371 (ICD-10-CM) - Right foot drop   THERAPY DIAG:  Unsteadiness on feet  Other abnormalities of gait and mobility  Foot drop, right  Muscle weakness (generalized)  Other lack of coordination  Rationale for Evaluation and Treatment: Rehabilitation  SUBJECTIVE:                                                                                                                                                                                             SUBJECTIVE STATEMENT: Has been wearing her AFO to work and it has been going well. Up to wearing her AFO for 5 hours. Also wearing  her ankle brace at work. Feels good with it picking up her leg. Feeling better about her balance with steps. Staying on her toes is getting easier.   Pt accompanied by: self  PERTINENT HISTORY: MOGAD, Anxiety  Per Dr. Epimenio Foot: Currently, she has mild right leg weakness and mild reduced gait. She is better than last year.  She could easily walk a mile and mostly keeps up with others. She has had no falls. She still has right leg numbness and tingling, also better since the last visit.  Bladder function has improved back to normal.  Vision is fine.   On Actemra once a week since June 2023   PAIN:  Are you having pain? Yes: NPRS scale: 2/10 Pain location: RLE Pain description: nerve pain     PRECAUTIONS: None  WEIGHT BEARING RESTRICTIONS: No  FALLS: Has patient fallen in last 6 months? 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   PATIENT GOALS: Strengthen R leg, improve stairs, going back to running   OBJECTIVE:   DIAGNOSTIC FINDINGS: MRI of the brain 04/11/2022 showed multiple T2/FLAIR hyperintense foci in the periventricular, juxtacortical and deep white matter. None of the foci enhanced after contrast. 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  MRI of the lumbar spine 04/11/2022: There are T2 hyperintense foci adjacent to T10 and adjacent to T11-T12 (posterior). They do not enhance.  COGNITION: Overall cognitive status: Within functional limits for tasks assessed   SENSATION: Light touch: Impaired  and With RLE, pt with numbness/tingling in R thigh and decr light touch more distally.   COORDINATION: Heel to shin: WFL    POSTURE: No Significant postural limitations   LOWER EXTREMITY MMT:    MMT  Right Eval Left Eval Right 06/30/23  Hip flexion 4 5 4+  Hip extension     Hip abduction 3- 5 4  Hip adduction 5 5   Hip internal rotation     Hip external rotation     Knee flexion 4+ 5 4+  Knee extension 4+ 4+ 5  Ankle dorsiflexion 3- 5 4+  Ankle plantarflexion     Ankle inversion     Ankle eversion     (Blank rows = not tested)  All tested in sitting    TODAY'S TREATMENT:   Therapeutic Activity   GAIT: Gait pattern: step through pattern, decreased hip/knee flexion- Right, and lateral hip instability Distance walked: Clinic distances  Assistive device utilized: None Level of assistance: Modified independence and SBA Comments: Pt wearing R ankle brace today with no ankle instability noted with tasks   Goal Assessment: Gait speed: 10.6 seconds = 3.1 ft/sec with R ankle brace  SLS: LLE: 22 seconds (could have gone longer but stopped pt) RLE: 5.7 seconds   Therapeutic Exercise  Half kneel on red mat:  With RLE and then LLE posteriorly, holding 6# medicine ball and doing press outs x5 reps and then trunk rotations x10 reps, performed with each side, pt more unsteady with RLE posteriorly. Repeated same progression of exercise for balance, core activation, functional hip strength, performed with anterior foot under balance disc for compliant surface and performed without holding medicine ball in case pt needed hands for balance, as pt frequently needing intermittent UE support when RLE was posteriorly   On elliptical in forwards direction for 3 minutes at gear 3.0 > 2.0 working on push off, speed, calf/BLE strengthening. Pt needing a 30 second standing rest break and then an additional 2 minutes in the backwards direction at gear 2.0. Pt with decr hip extension on R side when going posteriorly. Pt reporting RPE as 6-7/10 afterwards.   Pt asking about making HEP harder at end of session, briefly reviewed and trialed adding a green t-band to standing hip strengthening With hip  ABD, pt needing cues for  proper technique, it appeared that this band was too much resistance as pt with tendency to rotate pelvis posteriorly, discussed performing instead with red t-band  Performed with standing hip extension, with cues for technique and trying to keep working leg straight for hip extension, and trying to stand with tall posture, discussed could use red band for this at home   PATIENT EDUCATION: Education details: Results of goals, pt asking about HEP progressions at end of session - discussed using red t-band for standing hip extension and hip ABD strengthening tasks, will further look at this for next session, reviewed where pt can purchase a balance disc to use for exercises at home. pt asking about how long she would need to use R ankle brace/AFO, educated on purpose of using these due to severe lateral ankle instability with higher level tasks/activity and pt planning on going to see an orthopedic doctor regarding this (pt with hx of ankle injury in high school) Person educated: Patient Education method: Explanation, Demonstration, and Verbal cues Education comprehension: verbalized understanding and returned demonstration  HOME EXERCISE PROGRAM: Access Code: [email or web address] URL: [email or web address] Date: 06/30/2023 Prepared by: Sherlie Ban  Exercises - Forward Step Down with Heel Tap and Rail Support  - 1 x daily - 7 x weekly - 3 sets - 10 reps - Side Step Down with Counter Support  - 1 x daily - 7 x weekly - 3 sets - 10 reps - Side Stepping with Resistance at Thighs and Counter Support  - 1 x daily - 7 x weekly - 3-5 reps - Marching with Resistance  - 1 x daily - 7 x weekly - 3 sets - Romberg Stance Eyes Closed on Foam Pad  - 1 x daily - 7 x weekly - 3 sets - 30 hold - Walking with Head Rotation  - 1 x daily - 7 x weekly - 3 sets - Single Leg Heel Raise with Unilateral Counter Support  - 1 x daily - 7 x weekly - 3 sets - 10 reps - Standing Hip Extension  with Chair  - 1 x daily - 7 x weekly - 3 sets - 10 reps - Clamshell with Resistance  - 1 x daily - 7 x weekly - 2 sets - 10 reps - Mini Lunge  - 1 x daily - 5 x weekly - 1 sets - 10 reps - Single Leg Bridge  - 1 x daily - 5 x weekly - 1-2 sets - 10 reps - Prone Knee Flexion (Mirrored)  - 1 x daily - 5 x weekly - 1-2 sets - 10 reps - Standing Hip Abduction with Counter Support  - 1 x daily - 5 x weekly - 1-2 sets - 10 reps -Jogging in place   GOALS: Goals reviewed with patient? Yes  LONG TERM GOALS: Target date: 07/28/2023 UPDATED/IN PROGRESS LTGS FOR RE-CERT:   Pt will be independent with final HEP in order to build upon functional gains made in therapy. Baseline:  Goal status: IN PROGRESS  2.  Pt will be able to jog at least 230' with supervision in order for return to jogging.  Baseline: began to work on jogging in place and jogging in // bars  Goal status: IN PROGRESS  3.  Pt will improve gait speed with no AD to at least 3.4 ft/sec in order to demo improved community mobility.  Baseline: 3.01 ft/s (6/10); 2.9 ft/s (7/8)   3.28 ft/sec with foot up brace, 3.32 ft/sec with no foot up  brace   3.1 ft/sec with R ankle brace  Goal status: NOT MET   4. Pt will improve SLS time on RLE to at least 8 seconds in order to demo improved SLS stability for balance  Baseline: 4 seconds   5.7 seconds (9/9) Goal status: NOT MET    LONG TERM GOALS: Target date: 09/01/2023 UPDATED/IN PROGRESS LTGS FOR RE-CERT:   Pt will be independent with final HEP in order to build upon functional gains made in therapy. Baseline:  Goal status: IN PROGRESS  2.  Pt will be able to jog at least 230' with supervision in order for return to jogging.  Baseline: began to work on jogging in place and jogging in // bars  Goal status: IN PROGRESS  3. HI-MAT to be assessed with LTG updated for return to jogging. Baseline Goal status: NEW     ASSESSMENT:  CLINICAL IMPRESSION: Goals assessed today for  re-cert. Pt did not meet LTGs #3 and #4 in regards to gait speed and SLS. Pt able to hold LLE SLS >20 seconds, but only able to hold on RLE for a max of 5.7 seconds (previously 4 seconds). Pt's gait speed with only wearing R ankle brace was 3.1 ft/sec (previously was 3.28 ft/sec with foot up brace). Did not formally assess jogging today, but pt still working on jogging in place at home, and is currently wearing R ankle brace for improved stability with high level tasks. Will perform HiMAT at next session to further assessing dynamic mobility for return to jogging and LTG to be written. Pt will continue to benefit from skilled PT to address strength, gait, balance, coordination with LTGs updated/written as appropriate.  Continue POC.     OBJECTIVE IMPAIRMENTS: Abnormal gait, decreased activity tolerance, decreased balance, difficulty walking, decreased ROM, decreased strength, impaired sensation, and pain.   ACTIVITY LIMITATIONS: stairs and locomotion level  PARTICIPATION LIMITATIONS:  jogging  PERSONAL FACTORS: Age, Time since onset of injury/illness/exacerbation, and 1-2 comorbidities: MOGAD, Anxiety  are also affecting patient's functional outcome.   REHAB POTENTIAL: Good  CLINICAL DECISION MAKING: Stable/uncomplicated  EVALUATION COMPLEXITY: Low  PLAN:  PT FREQUENCY: 1x/week  PT DURATION: 8 weeks   PLANNED INTERVENTIONS: Therapeutic exercises, Therapeutic activity, Neuromuscular re-education, Balance training, Gait training, Patient/Family education, Self Care, Stair training, Vestibular training, Orthotic/Fit training, DME instructions, Electrical stimulation, Manual therapy, and Re-evaluation  PLAN FOR NEXT SESSION: make HEP more challenging as needed. PERFORM HI-MAT AND WRITE GOAL  hip strengthening, SLS tasks, unlevel surfaces  Continue trying jogging and jogging drills , push off tasks, quick feet with agility ladder, sled pushes      Drake Leach, PT, DPT  08/04/2023,  12:19 PM

## 2023-08-11 ENCOUNTER — Ambulatory Visit: Payer: 59 | Admitting: Physical Therapy

## 2023-08-18 ENCOUNTER — Encounter: Payer: Self-pay | Admitting: Physical Therapy

## 2023-08-18 ENCOUNTER — Ambulatory Visit: Payer: 59 | Admitting: Physical Therapy

## 2023-08-18 DIAGNOSIS — R278 Other lack of coordination: Secondary | ICD-10-CM | POA: Diagnosis not present

## 2023-08-18 DIAGNOSIS — M6281 Muscle weakness (generalized): Secondary | ICD-10-CM

## 2023-08-18 DIAGNOSIS — R2689 Other abnormalities of gait and mobility: Secondary | ICD-10-CM | POA: Diagnosis not present

## 2023-08-18 DIAGNOSIS — R2681 Unsteadiness on feet: Secondary | ICD-10-CM | POA: Diagnosis not present

## 2023-08-18 DIAGNOSIS — M21371 Foot drop, right foot: Secondary | ICD-10-CM

## 2023-08-18 NOTE — Therapy (Signed)
OUTPATIENT PHYSICAL THERAPY NEURO TREATMENT   Patient Name: Robin Mercer MRN: [id number] DOB:21-Apr-2001, 22 y.o., female Today's Date: 08/18/2023   PCP: Carin Hock, PA  REFERRING PROVIDER: Asa Lente, MD   END OF SESSION:  PT End of Session - 08/18/23 0933     Visit Number 18    Number of Visits 23   Recert   Date for PT Re-Evaluation 10/03/23   per re-cert on 03/01/95   Authorization Type [employer]    Progress Note Due on Visit 10    PT Start Time 0932    PT Stop Time 1014    PT Time Calculation (min) 42 min    Equipment Utilized During Treatment Gait belt    Activity Tolerance Patient tolerated treatment well    Behavior During Therapy WFL for tasks assessed/performed                  Past Medical History:  Diagnosis Date   Seasonal allergies    History reviewed. No pertinent surgical history. 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   Right foot drop 04/01/2023   High risk medication use 04/01/2023   Vitamin D deficiency 04/01/2023   Pyuria 04/13/2022   Marijuana use 04/11/2022   Multiple sclerosis (HCC) 04/11/2022    ONSET DATE: 04/01/2023 (date of referral)   REFERRING DIAG: G37.81 (ICD-10-CM) - Myelin oligodendrocyte glycoprotein antibody disorder (MOGAD) M21.371 (ICD-10-CM) - Right foot drop   THERAPY DIAG:  Unsteadiness on feet  Other abnormalities of gait and mobility  Foot drop, right  Muscle weakness (generalized)  Rationale for Evaluation and Treatment: Rehabilitation  SUBJECTIVE:                                                                                                                                                                                             SUBJECTIVE STATEMENT:  Almost tripped over R foot when ambulating into clinic today -caught her toe. Was able to catch herself. Was not wearing her foot up brace, just  has her lateral ankle brace. When walking, has been thinking about putting more weight on her right leg. Has been wearing AFO and it has been going well, except when she gets tired and it starts to ache a little bit. Still switching AFO out with her R lateral ankle brace. Wants to work on getting more weight to her R side. Has been noticing that prone knee flexion is getting easier.   Pt accompanied by: self  PERTINENT HISTORY: MOGAD, Anxiety  Per Dr. Epimenio Foot: Currently, she has mild right  leg weakness and mild reduced gait. She is better than last year.  She could easily walk a mile and mostly keeps up with others. She has had no falls. She still has right leg numbness and tingling, also better since the last visit. Bladder function has improved back to normal.  Vision is fine.   On Actemra once a week since June 2023   PAIN:  Are you having pain? Yes: NPRS scale: 2/10 Pain location: RLE Pain description: nerve pain     PRECAUTIONS: None  WEIGHT BEARING RESTRICTIONS: No  FALLS: Has patient fallen in last 6 months? 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   PATIENT GOALS: Strengthen R leg, improve stairs, going back to running   OBJECTIVE:   DIAGNOSTIC FINDINGS: MRI of the brain 04/11/2022 showed multiple T2/FLAIR hyperintense foci in the periventricular, juxtacortical and deep white matter. None of the foci enhanced after contrast. 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  MRI of the lumbar spine 04/11/2022: There are T2 hyperintense foci adjacent to T10 and adjacent to T11-T12 (posterior). They do not enhance.  COGNITION: Overall cognitive status: Within functional limits for tasks  assessed   SENSATION: Light touch: Impaired  and With RLE, pt with numbness/tingling in R thigh and decr light touch more distally.   COORDINATION: Heel to shin: WFL    POSTURE: No Significant postural limitations   LOWER EXTREMITY MMT:    MMT Right Eval Left Eval Right 06/30/23  Hip flexion 4 5 4+  Hip extension     Hip abduction 3- 5 4  Hip adduction 5 5   Hip internal rotation     Hip external rotation     Knee flexion 4+ 5 4+  Knee extension 4+ 4+ 5  Ankle dorsiflexion 3- 5 4+  Ankle plantarflexion     Ankle inversion     Ankle eversion     (Blank rows = not tested)  All tested in sitting    TODAY'S TREATMENT:   Therapeutic Activity Pt ambulating into therapy with R ankle brace and wanting to demonstrate to therapist on how she is ambulating, as she feels like she does not get enough push off through RLE, noted pt taking a shorter step length with RLE and having decr stance time on RLE. Cued to incr stride length to help incr stance time on RLE, with pt reporting that she felt more stable trying to do this, performed 4 x 20'. Pt also reports that she feels this way when she wears her R AFO, pt donned her AFO and also demonstrating decr stride length and decr step length with RLE. Provided cues for longer steps with pt demonstrating improvement stance time on RLE, heel strike, and improved push off. Pt reporting this felt better for her balance when performing this way when focusing on longer strides. Performed 5 x 20' with R AFO. Pt kept AFO donned for remainder of session.   GAIT: Gait pattern: step through pattern, decreased hip/knee flexion- Right, and lateral hip instability Distance walked: Clinic distances  Assistive device utilized: None Level of assistance: Modified independence and SBA Comments: Pt wearing R AFO during session today  NMR:  Added/upgraded below exercises to HEP to work on weight shifting, SLS:  -  Marching with Resistance  - 1 x daily - 7 x  weekly - 3 sets - with use of blue t-band around thighs, cues for slowed pace, hip flexion and trying to stand on each leg for approx. 3 seconds, pt more challenged with SLS on RLE and needing UE support at times, performed as a walking march, with RLE cues for quad and glute activation when in SLS   - Standing Repeated Hip Abduction on Foam Pad  - 1 x daily - 5 x weekly - 1-2 sets - 10 reps - with stance leg on air ex pad, performed 10-12 reps each leg, working on ankle strategy/SLS, cues to decr UE support when able   - Standing Weight Shift  - 1 x daily - 5 x weekly - 1-2 sets - 10 reps - lifting up opposite leg for dynamic SLS 10 reps each side - first beginning slowly and progressed to speeding task up for push off    With 6 blaze pods on first or 2nd step at staircase for SLS, weight shifting towards R side, ankle strategy, performed on random hit setting. All reps performed with SLS on RLE with pt standing on air ex  Performed 4 bouts of 1 minute each: 23, 27, 27, 34 hits  Cued to try to keep RLE over step/pods instead of tapping it to the air ex between each rep   PATIENT EDUCATION: Education details: Additions to HEP for SLS/ankle strategy on compliant surfaces, taking longer strides with gait for incr weight shifting to RLE  Person educated: Patient Education method: Explanation, Demonstration, Verbal cues, and Handouts Education comprehension: verbalized understanding and returned demonstration  HOME EXERCISE PROGRAM: Access Code: [email or web address] URL: [email or web address] Date: 08/18/2023 Prepared by: Sherlie Ban  Exercises - Forward Step Down with Heel Tap and Rail Support  - 1 x daily - 7 x weekly - 3 sets - 10 reps - Side Step Down with Counter Support  - 1 x daily - 7 x weekly - 3 sets - 10 reps - Side Stepping with Resistance at Thighs and Counter Support  - 1 x daily - 7 x weekly - 3-5 reps - Walking with Head Rotation  - 1 x daily - 7 x weekly - 3 sets -  Single Leg Heel Raise with Unilateral Counter Support  - 1 x daily - 7 x weekly - 3 sets - 10 reps - Standing Hip Extension with Chair  - 1 x daily - 7 x weekly - 3 sets - 10 reps - Clamshell with Resistance  - 1 x daily - 7 x weekly - 2 sets - 10 reps - Mini Lunge  - 1 x daily - 5 x weekly - 1 sets - 10 reps - Prone Knee Flexion (Mirrored)  - 1 x daily - 5 x weekly - 1-2 sets - 10 reps - Single Leg Bridge  - 1 x daily - 5 x weekly - 1-2 sets - 10 reps - Marching with Resistance  - 1 x daily - 7 x weekly - 3 sets - Standing Repeated Hip Abduction on Foam Pad  - 1 x daily - 5 x weekly - 1-2 sets - 10 reps - Standing Weight Shift  - 1 x daily - 5 x weekly - 1-2 sets - 10 reps - lifting up opposite leg for dynamic SLS -Jogging in place   GOALS: Goals reviewed with patient? Yes  LONG TERM GOALS: Target date: 07/28/2023 UPDATED/IN PROGRESS LTGS FOR RE-CERT:  Pt will be independent with final HEP in order to build upon functional gains made in therapy. Baseline:  Goal status: IN PROGRESS  2.  Pt will be able to jog at least 230' with supervision in order for return to jogging.  Baseline: began to work on jogging in place and jogging in // bars  Goal status: IN PROGRESS  3.  Pt will improve gait speed with no AD to at least 3.4 ft/sec in order to demo improved community mobility.  Baseline: 3.01 ft/s (6/10); 2.9 ft/s (7/8)   3.28 ft/sec with foot up brace, 3.32 ft/sec with no foot up brace   3.1 ft/sec with R ankle brace  Goal status: NOT MET   4. Pt will improve SLS time on RLE to at least 8 seconds in order to demo improved SLS stability for balance  Baseline: 4 seconds   5.7 seconds (9/9) Goal status: NOT MET    LONG TERM GOALS: Target date: 09/01/2023 UPDATED/IN PROGRESS LTGS FOR RE-CERT:   Pt will be independent with final HEP in order to build upon functional gains made in therapy. Baseline:  Goal status: IN PROGRESS  2.  Pt will be able to jog at least 230' with  supervision in order for return to jogging.  Baseline: began to work on jogging in place and jogging in // bars  Goal status: IN PROGRESS  3. HI-MAT to be assessed with LTG updated for return to jogging. Baseline Goal status: NEW     ASSESSMENT:  CLINICAL IMPRESSION: When ambulating into session today, with pt just wearing R lateral ankle brace, pt with one instance of her R toes catching and almost fell, but pt able to use wall to help catch herself. Pt reporting that during walking she feels as she puts less weight through her R side. Pt demonstrated with R ankle brace and R AFO, with pt with decr stride length. Cued to incr stride length and pt able to demo more equal weight bearing with pt also feeling more steady. Remainder of session focused on SLS tasks to R side and updated HEP to include these. Pt continues to be challenged with SLS tasks with RLE on a compliant surface and longer holds for stability. Will continue per POC.   OBJECTIVE IMPAIRMENTS: Abnormal gait, decreased activity tolerance, decreased balance, difficulty walking, decreased ROM, decreased strength, impaired sensation, and pain.   ACTIVITY LIMITATIONS: stairs and locomotion level  PARTICIPATION LIMITATIONS:  jogging  PERSONAL FACTORS: Age, Time since onset of injury/illness/exacerbation, and 1-2 comorbidities: MOGAD, Anxiety  are also affecting patient's functional outcome.   REHAB POTENTIAL: Good  CLINICAL DECISION MAKING: Stable/uncomplicated  EVALUATION COMPLEXITY: Low  PLAN:  PT FREQUENCY: 1x/week  PT DURATION: 8 weeks   PLANNED INTERVENTIONS: Therapeutic exercises, Therapeutic activity, Neuromuscular re-education, Balance training, Gait training, Patient/Family education, Self Care, Stair training, Vestibular training, Orthotic/Fit training, DME instructions, Electrical stimulation, Manual therapy, and Re-evaluation  PLAN FOR NEXT SESSION:  Pt has 1 more appt left? Discuss possibly taking a break  from therapy at this time?  hip strengthening, SLS tasks, unlevel surfaces  Continue trying jogging and jogging drills , push off tasks, quick feet with agility ladder, sled pushes      Drake Leach, PT, DPT  08/18/2023, 12:49 PM

## 2023-08-25 ENCOUNTER — Ambulatory Visit: Payer: 59 | Admitting: Physical Therapy

## 2023-08-25 DIAGNOSIS — R2681 Unsteadiness on feet: Secondary | ICD-10-CM

## 2023-08-25 DIAGNOSIS — R278 Other lack of coordination: Secondary | ICD-10-CM | POA: Diagnosis not present

## 2023-08-25 DIAGNOSIS — R2689 Other abnormalities of gait and mobility: Secondary | ICD-10-CM

## 2023-08-25 DIAGNOSIS — M21371 Foot drop, right foot: Secondary | ICD-10-CM

## 2023-08-25 DIAGNOSIS — M6281 Muscle weakness (generalized): Secondary | ICD-10-CM | POA: Diagnosis not present

## 2023-08-25 NOTE — Therapy (Signed)
OUTPATIENT PHYSICAL THERAPY NEURO TREATMENT - DISCHARGE SUMMARY    Patient Name: Robin Mercer MRN: [id number] DOB:01/01/2001, 22 y.o., female Today's Date: 08/25/2023   PCP: Carin Hock, PA  REFERRING PROVIDER: Asa Lente, MD  PHYSICAL THERAPY DISCHARGE SUMMARY  Visits from Start of Care: 59  Current functional level related to goals / functional outcomes: Mod I w/mobility w/use of R AFO, R foot-up and R ankle stabilizer brace    Remaining deficits: RLE weakness w/R foot drop, low fall risk, decreased activity tolerance    Education / Equipment: HEP, R AFO    Patient agrees to discharge. Patient goals were partially met. Patient is being discharged due to being pleased with the current functional level.    END OF SESSION:  PT End of Session - 08/25/23 0934     Visit Number 19    Number of Visits 23   Recert   Date for PT Re-Evaluation 10/03/23   per re-cert on 05/27/21   Authorization Type [employer]    Progress Note Due on Visit 10    PT Start Time 0933    PT Stop Time 0948   DC   PT Time Calculation (min) 15 min    Equipment Utilized During Treatment --    Activity Tolerance Patient tolerated treatment well    Behavior During Therapy WFL for tasks assessed/performed                   Past Medical History:  Diagnosis Date   Seasonal allergies    No past surgical history on file. 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   Right foot drop 04/01/2023   High risk medication use 04/01/2023   Vitamin D deficiency 04/01/2023   Pyuria 04/13/2022   Marijuana use 04/11/2022   Multiple sclerosis (HCC) 04/11/2022    ONSET DATE: 04/01/2023 (date of referral)   REFERRING DIAG: G37.81 (ICD-10-CM) - Myelin oligodendrocyte glycoprotein antibody disorder (MOGAD) M21.371 (ICD-10-CM) - Right foot drop   THERAPY DIAG:  Unsteadiness on feet  Other  abnormalities of gait and mobility  Foot drop, right  Muscle weakness (generalized)  Other lack of coordination  Rationale for Evaluation and Treatment: Rehabilitation  SUBJECTIVE:                                                                                                                                                                                             SUBJECTIVE STATEMENT:  Pt presents w/R ankle brace on. Denies falls or acute changes. Goes to work with her AFO on and then  switches to her ankle stabilizer about halfway into her shift. Has been working on jogging in place and feels better about keeping her weight on her forefoot rather than heel striking when jogging.   Pt accompanied by: self  PERTINENT HISTORY: MOGAD, Anxiety  Per Dr. Epimenio Foot: Currently, she has mild right leg weakness and mild reduced gait. She is better than last year.  She could easily walk a mile and mostly keeps up with others. She has had no falls. She still has right leg numbness and tingling, also better since the last visit. Bladder function has improved back to normal.  Vision is fine.   On Actemra once a week since June 2023   PAIN:  Are you having pain? Yes: NPRS scale: 2-3/10 Pain location: RLE Pain description: nerve pain     PRECAUTIONS: None  WEIGHT BEARING RESTRICTIONS: No  FALLS: Has patient fallen in last 6 months? 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   PATIENT GOALS: Strengthen R leg, improve stairs, going back to running   OBJECTIVE:   DIAGNOSTIC FINDINGS: MRI of the brain 04/11/2022 showed multiple T2/FLAIR hyperintense foci in the periventricular, juxtacortical and deep  white matter. None of the foci enhanced after contrast. 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  MRI of the lumbar spine 04/11/2022: There are T2 hyperintense foci adjacent to T10 and adjacent to T11-T12 (posterior). They do not enhance.  COGNITION: Overall cognitive status: Within functional limits for tasks assessed   SENSATION: Light touch: Impaired  and With RLE, pt with numbness/tingling in R thigh and decr light touch more distally.   COORDINATION: Heel to shin: WFL    POSTURE: No Significant postural limitations   LOWER EXTREMITY MMT:    MMT Right Eval Left Eval Right 06/30/23  Hip flexion 4 5 4+  Hip extension     Hip abduction 3- 5 4  Hip adduction 5 5   Hip internal rotation     Hip external rotation     Knee flexion 4+ 5 4+  Knee extension 4+ 4+ 5  Ankle dorsiflexion 3- 5 4+  Ankle plantarflexion     Ankle inversion     Ankle eversion     (Blank rows = not tested)  All tested in sitting    TODAY'S TREATMENT:   Ther Act  Finalized EP (see bolded below) for added challenge w/hip abduction strength and progression of return to jogging. Pt demonstrated all exercises well and verbalized understanding  Pt in agreement to DC this date due to being satisfied with current functional level and wanting to take break from PT. Educated pt on how to obtain new PT referral when she would like to return (preferably from Dr. Epimenio Foot). Pt verbalized understanding.     PATIENT EDUCATION: Education details: See above Person educated: Patient Education method: Explanation, Demonstration, Verbal cues, and Handouts Education comprehension: verbalized understanding and returned demonstration  HOME EXERCISE PROGRAM: Access Code: [email or web address] URL: [email or web address] Date: 08/18/2023 Prepared by: Sherlie Ban  Exercises - Forward Step Down with Heel Tap and Rail Support  - 1 x daily - 7 x weekly - 3 sets - 10 reps - Side Step Down with Counter Support  - 1 x daily - 7 x  weekly - 3  sets - 10 reps - Side Stepping with Resistance at Thighs and Counter Support  - 1 x daily - 7 x weekly - 3-5 reps - Walking with Head Rotation  - 1 x daily - 7 x weekly - 3 sets - Single Leg Heel Raise with Unilateral Counter Support  - 1 x daily - 7 x weekly - 3 sets - 10 reps - Standing Hip Extension with Chair  - 1 x daily - 7 x weekly - 3 sets - 10 reps - Clamshell with Resistance  - 1 x daily - 7 x weekly - 2 sets - 10 reps - Mini Lunge  - 1 x daily - 5 x weekly - 1 sets - 10 reps - Prone Knee Flexion (Mirrored)  - 1 x daily - 5 x weekly - 1-2 sets - 10 reps - Single Leg Bridge  - 1 x daily - 5 x weekly - 1-2 sets - 10 reps - Marching with Resistance  - 1 x daily - 7 x weekly - 3 sets - Standing Repeated Hip Abduction on Foam Pad  - 1 x daily - 5 x weekly - 1-2 sets - 10 reps - Standing Weight Shift  - 1 x daily - 5 x weekly - 1-2 sets - 10 reps - lifting up opposite leg for dynamic SLS -Jogging in place  - Side Plank with Clam  - 1 x daily - 7 x weekly - 3 sets - 10 reps - Standing [employer]  - 1 x daily - 7 x weekly - 3 sets - 10 reps  GOALS: Goals reviewed with patient? Yes  LONG TERM GOALS: Target date: 07/28/2023 UPDATED/IN PROGRESS LTGS FOR RE-CERT:   Pt will be independent with final HEP in order to build upon functional gains made in therapy. Baseline:  Goal status: IN PROGRESS  2.  Pt will be able to jog at least 230' with supervision in order for return to jogging.  Baseline: began to work on jogging in place and jogging in // bars  Goal status: IN PROGRESS  3.  Pt will improve gait speed with no AD to at least 3.4 ft/sec in order to demo improved community mobility.  Baseline: 3.01 ft/s (6/10); 2.9 ft/s (7/8)   3.28 ft/sec with foot up brace, 3.32 ft/sec with no foot up brace   3.1 ft/sec with R ankle brace  Goal status: NOT MET   4. Pt will improve SLS time on RLE to at least 8 seconds in order to demo improved SLS stability for balance   Baseline: 4 seconds   5.7 seconds (9/9) Goal status: NOT MET    LONG TERM GOALS: Target date: 09/01/2023 UPDATED/IN PROGRESS LTGS FOR RE-CERT:   Pt will be independent with final HEP in order to build upon functional gains made in therapy. Baseline:  Goal status: MET  2.  Pt will be able to jog at least 230' with supervision in order for return to jogging.  Baseline: began to work on jogging in place and jogging in // bars  Goal status: NOT MET   3. HI-MAT to be assessed with LTG updated for return to jogging. Baseline Goal status: DC - NOT ASSESSED      ASSESSMENT:  CLINICAL IMPRESSION: Emphasis of skilled PT session on LTG Assessment and DC from PT. Pt has met 1 of 2 LTGs, performing her HEP regularly. Pt did not meet her jogging goal but reports jogging is getting easier and she  is working on this at home. Did not assess HiMat, so discontinued this goal but plan on assessing when pt returns to therapy. Pt in agreement to take break from PT to work on her HEP at home and return to jogging. Pt is ambulating at a mod I level w/use of R AFO and is a low fall risk.   OBJECTIVE IMPAIRMENTS: Abnormal gait, decreased activity tolerance, decreased balance, difficulty walking, decreased ROM, decreased strength, impaired sensation, and pain.   ACTIVITY LIMITATIONS: stairs and locomotion level  PARTICIPATION LIMITATIONS:  jogging  PERSONAL FACTORS: Age, Time since onset of injury/illness/exacerbation, and 1-2 comorbidities: MOGAD, Anxiety  are also affecting patient's functional outcome.   REHAB POTENTIAL: Good  CLINICAL DECISION MAKING: Stable/uncomplicated  EVALUATION COMPLEXITY: Low  PLAN:  PT FREQUENCY: 1x/week  PT DURATION: 8 weeks   PLANNED INTERVENTIONS: Therapeutic exercises, Therapeutic activity, Neuromuscular re-education, Balance training, Gait training, Patient/Family education, Self Care, Stair training, Vestibular training, Orthotic/Fit training, DME  instructions, Electrical stimulation, Manual therapy, and Re-evaluation     Dashawn Bartnick E Rosalynd Mcwright, PT, DPT  08/25/2023, 9:52 AM

## 2023-10-08 ENCOUNTER — Ambulatory Visit: Payer: 59 | Admitting: Neurology

## 2023-10-08 ENCOUNTER — Encounter: Payer: Self-pay | Admitting: Neurology

## 2023-10-08 ENCOUNTER — Other Ambulatory Visit (HOSPITAL_COMMUNITY): Payer: Self-pay

## 2023-10-08 VITALS — BP 127/82 | HR 71 | Ht 66.0 in | Wt 149.0 lb

## 2023-10-08 DIAGNOSIS — R200 Anesthesia of skin: Secondary | ICD-10-CM | POA: Diagnosis not present

## 2023-10-08 DIAGNOSIS — G3781 Myelin oligodendrocyte glycoprotein antibody disease: Secondary | ICD-10-CM | POA: Diagnosis not present

## 2023-10-08 DIAGNOSIS — M21371 Foot drop, right foot: Secondary | ICD-10-CM | POA: Diagnosis not present

## 2023-10-08 DIAGNOSIS — R269 Unspecified abnormalities of gait and mobility: Secondary | ICD-10-CM | POA: Diagnosis not present

## 2023-10-08 MED ORDER — VITAMIN D (ERGOCALCIFEROL) 1.25 MG (50000 UNIT) PO CAPS
50000.0000 [IU] | ORAL_CAPSULE | ORAL | 3 refills | Status: DC
Start: 2023-10-08 — End: 2024-10-16
  Filled 2023-10-08: qty 13, 90d supply, fill #0
  Filled 2024-01-07: qty 13, 90d supply, fill #1
  Filled 2024-04-08 – 2024-04-23 (×2): qty 13, 90d supply, fill #2
  Filled 2024-07-20: qty 13, 90d supply, fill #3

## 2023-10-08 NOTE — Progress Notes (Signed)
[facility]  PATIENT: Robin Mercer DOB: February 07, 2001  REFERRING DOCTOR OR PCP: Mare Loan, MD SOURCE: Patient, notes from recent hospitalization, imaging and lab reports, multiple MRI images personally reviewed.  _________________________________   HISTORICAL  CHIEF COMPLAINT:  Chief Complaint  Patient presents with   Room 11    Pt is here Alone. Pt states that she has been doing good since her last appointment.     HISTORY OF PRESENT ILLNESS:  Robin Mercer is a 22 year old woman with MOGAD  UPDATE 10/08/2023: She started Actemra June 2023 and tolerate it well.     She has mild right leg weakness and mild reduced gait.   She is better than last year.   She did PT/Neuro Rehab with benefit.   She has a right ankle stabilizer.   She could easily walk a mile.  She pretty much keeps up with others.    She has had no falls.     She still has right leg numbness and tingling, also better since the last visit   Tingling comes in waves but is not painful.   Gabapentin has helped.    Bladder hesitancy improved.     Vision is slightly worse.   She wears colors   Notes no change in color vision.     She notes no major issue with fatigue.    She sleeps well most nights.    She sometimes has mild depressed.   She feels cognition is doing well.    She is in school for nursing.     History of demyelinating disease: She had right calf weakness starting in spring 2020.  She had a couple other times she noted leg symptoms.    The onset was gradual and she was fairly stable the next 2 - 3 years.    In May 2023 she had the onset of numbness in her right leg and gait worsened.  This was worse than there episodes.    The paresthesias worsened when she walked further.   She also had urinary hesitancy.   Due to the symptoms, she went to the ED and was admitted for 5 days of IV Solumedrol.   She felt she improved initially but symptoms are worse now than at discharge.    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    Imaging reviewed: MRI of the brain 04/11/2022 showed multiple T2/FLAIR hyperintense foci in the periventricular, juxtacortical and deep white matter.  None of the foci enhanced after contrast.  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  MRI of the lumbar spine 04/11/2022: There are T2 hyperintense foci adjacent to T10 and adjacent to T11-T12 (posterior).  They do not enhance.  MRI of the cervical and thoracic spine 04/13/2022 shows a longitudinally extensive T2 hyperintense focus from C1-C2 caudally to C7.  Additionally, there are T2 hyperintense foci within the thoracic spinal cord adjacent to T4, T7, T10 and T12.  None of the foci enhance.  No significant degenerative change.  Laboratory: 03/2022:  JCV DNA PCR negative.  HIV negative REVIEW OF SYSTEMS: Constitutional: No fevers, chills, sweats, or change in appetite Eyes: No visual changes, double vision, eye pain Ear, nose and throat: No hearing loss, ear pain, nasal congestion, sore throat Cardiovascular: No chest pain, palpitations Respiratory:  No shortness of breath at rest or with exertion.   No wheezes GastrointestinaI: No nausea,  vomiting, diarrhea, abdominal pain, fecal incontinence Genitourinary:  No dysuria, urinary retention or frequency.  No nocturia. Musculoskeletal:  No neck pain, back pain Integumentary: No rash, pruritus, skin lesions Neurological: as above Psychiatric: No depression at this time.  No anxiety Endocrine: No palpitations, diaphoresis, change in appetite, change in weigh or increased thirst Hematologic/Lymphatic:  No anemia, purpura, petechiae. Allergic/Immunologic: No itchy/runny eyes, nasal congestion, recent allergic reactions, rashes  ALLERGIES: No Known Allergies  HOME MEDICATIONS:  Current Outpatient Medications:    gabapentin (NEURONTIN) 400 MG capsule, Take 1 capsule (400 mg total) by  mouth 4 (four) times daily., Disp: 360 capsule, Rfl: 4   sertraline (ZOLOFT) 100 MG tablet, Take 1/2 tablet (50 mg total) by mouth daily., Disp: 90 tablet, Rfl: 4   Tocilizumab (ACTEMRA ACTPEN) 162 MG/0.9ML SOAJ, Inject 162 mg into the skin once a week., Disp: 10.8 mL, Rfl: 3   MAGNESIUM PO, Take 1 tablet by mouth daily. (Patient not taking: Reported on 04/01/2023), Disp: , Rfl:    Vitamin D, Ergocalciferol, (DRISDOL) 1.25 MG (50000 UNIT) CAPS capsule, Take 1 capsule (50,000 Units total) by mouth every 7 (seven) days., Disp: 13 capsule, Rfl: 3  PAST MEDICAL HISTORY: Past Medical History:  Diagnosis Date   Seasonal allergies     PAST SURGICAL HISTORY: History reviewed. No pertinent surgical history.  FAMILY HISTORY: Family History  Problem Relation Age of Onset   Hypertension Father    Multiple sclerosis Maternal Great-grandmother     SOCIAL HISTORY:  Social History   Socioeconomic History   Marital status: Single    Spouse name: Not on file   Number of children: Not on file   Years of education: Not on file   Highest education level: High school graduate  Occupational History   Not on file  Tobacco Use   Smoking status: Never   Smokeless tobacco: Never  Substance and Sexual Activity   Alcohol use: Not Currently    Comment: Occasional   Drug use: Yes    Types: Marijuana    Comment: 1-2 a day   Sexual activity: Not on file  Other Topics Concern   Not on file  Social History Narrative   Lives with parents and siblings   R handed   Caffeine: rarely    Social Determinants of Health   Financial Resource Strain: Not on file  Food Insecurity: Not on file  Transportation Needs: Not on file  Physical Activity: Not on file  Stress: Not on file  Social Connections: Not on file  Intimate Partner Violence: Not on file     PHYSICAL EXAM  Vitals:   10/08/23 1613  BP: 127/82  Pulse: 71  Weight: 149 lb (67.6 kg)  Height: 5\' 6"  (1.676 m)    Body mass index is 24.05  kg/m.  VA:   OS 20/30   OD 20/20   Colors symmetric  General: The patient is well-developed and well-nourished and in no acute distress  HEENT:  Head is [place]/AT.  Sclera are anicteric.    Skin: Extremities are without rash or  edema.  Musculoskeletal:  Back is nontender  Neurologic Exam  Mental status: The patient is alert and oriented x 3 at the time of the examination. The patient has apparent normal recent and remote memory, with an apparently normal attention span and concentration ability.   Speech is normal.  Cranial nerves: Extraocular movements are full. Pupils are equal, round, and reactive to light and accomodation. Color vision was symmetric.  There  is good facial sensation to soft touch bilaterally.Facial strength is normal.  Trapezius and sternocleidomastoid strength is normal. No dysarthria is noted.   No obvious hearing deficits are noted.  Motor:  Muscle bulk is normal.   Tone is slightly increased in legs. Strength is  5 / 5 in all 4 extremities.   Sensory: Sensory testing is intact to pinprick, soft touch and vibration sensation in all 4 extremities.  Coordination: Cerebellar testing reveals good finger-nose-finger and reduced right heel-to-shin .  Gait and station: Station is normal.   Gait is mildly right spastic and mildly wide . Tandem gait is poor. Romberg is negative.   Reflexes: Deep tendon reflexes are increased in legs, right > left (spread 2 beats nonsustained clonus) and normal bilaterally.   Plantar responses are flexor left and extensor right.    DIAGNOSTIC DATA (LABS, IMAGING, TESTING) - I reviewed patient records, labs, notes, testing and imaging myself where available.  Lab Results  Component Value Date   WBC 6.2 04/01/2023   HGB 13.8 04/01/2023   HCT 41.6 04/01/2023   MCV 90 04/01/2023   PLT 292 04/01/2023      Component Value Date/Time   NA 140 04/01/2023 1345   K 4.1 04/01/2023 1345   CL 104 04/01/2023 1345   CO2 21 04/01/2023 1345    GLUCOSE 86 04/01/2023 1345   GLUCOSE 126 (H) 04/15/2022 0143   BUN 7 04/01/2023 1345   CREATININE 0.77 04/01/2023 1345   CALCIUM 9.4 04/01/2023 1345   PROT 7.0 04/01/2023 1345   ALBUMIN 4.4 04/01/2023 1345   AST 15 04/01/2023 1345   ALT 15 04/01/2023 1345   ALKPHOS 52 04/01/2023 1345   BILITOT 0.4 04/01/2023 1345   GFRNONAA >60 04/15/2022 0143    Lab Results  Component Value Date   TSH 0.937 04/11/2022       ASSESSMENT AND PLAN  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  The anti-MOG antibody was positive (though titers were low).  I discussed with her that I believe the right diagnosis is MOGAD but there is still some concern that she could have an atypical presentation of multiple sclerosis.  Since she is having some additional visual symptoms, I will check an MRI of the brain and spinal cord to determine if there has been progression.  If this is occurring, despite the Actemra, we need to consider an anti-CD20 agent She has a right foot drop.  Wear ankle support Stay active and exercise as tolerated Continue gabapentin. Rtc 6 months, sooner if new or worsening neurologic symptom.   Robin Mercer A. Epimenio Foot, MD, [facility] 10/08/2023, 4:30 PM Certified in Neurology, Clinical Neurophysiology, Sleep Medicine and Neuroimaging  [facility] [address] [place], [place] [address] [phone]

## 2023-10-21 ENCOUNTER — Ambulatory Visit (INDEPENDENT_AMBULATORY_CARE_PROVIDER_SITE_OTHER): Payer: 59

## 2023-10-21 DIAGNOSIS — R269 Unspecified abnormalities of gait and mobility: Secondary | ICD-10-CM | POA: Diagnosis not present

## 2023-10-21 DIAGNOSIS — G3781 Myelin oligodendrocyte glycoprotein antibody disease: Secondary | ICD-10-CM | POA: Diagnosis not present

## 2023-10-21 DIAGNOSIS — R200 Anesthesia of skin: Secondary | ICD-10-CM | POA: Diagnosis not present

## 2023-10-21 MED ORDER — GADOBENATE DIMEGLUMINE 529 MG/ML IV SOLN
14.0000 mL | Freq: Once | INTRAVENOUS | Status: AC | PRN
  Administered 2023-10-21: 14 mL via INTRAVENOUS

## 2023-12-03 DIAGNOSIS — H938X2 Other specified disorders of left ear: Secondary | ICD-10-CM | POA: Diagnosis not present

## 2023-12-03 DIAGNOSIS — Z6826 Body mass index (BMI) 26.0-26.9, adult: Secondary | ICD-10-CM | POA: Diagnosis not present

## 2023-12-03 DIAGNOSIS — R0981 Nasal congestion: Secondary | ICD-10-CM | POA: Diagnosis not present

## 2023-12-25 ENCOUNTER — Other Ambulatory Visit (HOSPITAL_COMMUNITY): Payer: Self-pay

## 2023-12-31 ENCOUNTER — Telehealth: Payer: Self-pay | Admitting: *Deleted

## 2023-12-31 NOTE — Telephone Encounter (Signed)
 Took call from phone staff and spoke w/ Gretta from [facility]. They were going through annual verification w/ pt and she informed them she has [place] now. They have 30 days until they un-enroll pt. Needing to confirm coverage via [employer]. A PA is needing to be done. Aware we will send to our prior authorization team to submit urgently and then will fax  them determination from [place] once back. He verbalized understanding.    Original order: Actemra  SQ autoinjector, inject 162mg  once a week. 90 days supply w/ 3 refills. Dx codes: G36.8.

## 2024-01-01 ENCOUNTER — Other Ambulatory Visit (HOSPITAL_COMMUNITY): Payer: Self-pay

## 2024-01-01 ENCOUNTER — Telehealth: Payer: Self-pay

## 2024-01-01 NOTE — Telephone Encounter (Signed)
 Pharmacy Patient Advocate Encounter   Received notification from Physician's Office that prior authorization for Actemra  ACTPen 162MG /0.9ML auto-injectors is required/requested.   Insurance verification completed.   The patient is insured through [facility] .   Per test claim: PA required; PA submitted to above mentioned insurance via CoverMyMeds Key/confirmation #/EOC [id number] Status is pending

## 2024-01-01 NOTE — Telephone Encounter (Signed)
 PA request has been Submitted. New Encounter created for follow up. For additional info see Pharmacy Prior Auth telephone encounter from 01/01/2024.

## 2024-01-05 NOTE — Telephone Encounter (Signed)
 Hello, do you have any updates on this? Thank you

## 2024-01-07 ENCOUNTER — Encounter: Payer: Self-pay | Admitting: *Deleted

## 2024-01-07 ENCOUNTER — Other Ambulatory Visit (HOSPITAL_COMMUNITY): Payer: Self-pay

## 2024-01-07 NOTE — Telephone Encounter (Signed)
Hello,  I do not see denial uploaded under media. We are going to try and appeal urgently. Can you upload this? Thank you

## 2024-01-07 NOTE — Telephone Encounter (Signed)
Pharmacy Patient Advocate Encounter  Received notification from [facility] that Prior Authorization for Actemra ACTPen 162MG /0.9ML auto-injectors  has been DENIED.  Full denial letter will be uploaded to the media tab. See denial reason below. This request has not been approved. Based on the information sent for review, the requested drug did not meet our guideline rules. To get the request approved, your doctor needs to show that you have met the guideline rules below. If you have questions, please call your doctor. In some cases, the requested drug or alternatives offered may have other guideline rules that need to be met. Our guideline named TOCILIZUMAB - SQ (Actemra subcutaneous) requires that you have ONE of the following: 1. Moderate to severe rheumatoid arthritis (RA: a type of joint condition) 2. Giant cell arteritis (GCA: a type of inflammatory condition) 3. Systemic sclerosis-associated interstitial lung disease (SSc-ILD: disorder that causes hardening of lung tissue) 4. Polyarticular juvenile idiopathic arthritis (PJIA: a type of joint condition) 5. Systemic juvenile idiopathic arthritis (SJIA: a type of joint condition).Your doctor told us that you have myelin oligodendrocyte glycoprotein antibody disease (MOGAD: an autoimmune disorder), which does not meet the requirements for approval as listed above. Because of this, additional requirements must be met for approval. When used for the treatment of an off-label use (not approved for this use by the Food and Drug Administration [FDA]), our guideline named OFF-LABEL (reviewed for Actemra) requires that the following conditions be met: 1) Your provider has provided documentation that supports that the requested off-label use is considered safe and effective by approved compendia (medical references such as Clinical Pharmacology, [place] [facility], and [employer] Lexi-Drugs), guidelines issued by leading nationally-recognized  associations and agencies (such as the [employer] of Gastroenterology or the [employer]). 2) Your provider must submit at least two (2) high-quality articles from major peer-reviewed medical journals (such as Lancet or The [place] Journal of Medicine) support  PA #/Case ID/Reference #: [id number]

## 2024-01-08 ENCOUNTER — Encounter: Payer: Self-pay | Admitting: *Deleted

## 2024-01-08 NOTE — Telephone Encounter (Signed)
Appeal letter written, waiting on MD signature.  I called pt. Confirmed she has same insurance as last yr, ID # same. Did not receive new cards.  Confirmed she has medication on hand, will call to get refill and if any issues getting refill, she will let us know.

## 2024-01-15 NOTE — Telephone Encounter (Signed)
 Received signed appeal letter back from MD. Faxed to [employer] appeals department at [phone]. Marked urgent, waiting on determination.

## 2024-01-19 NOTE — Telephone Encounter (Signed)
 Received Voicemail from [employer] pharmacy benefit insurance [phone] stated they are faxing over approval letter for this

## 2024-01-19 NOTE — Telephone Encounter (Signed)
 We have not received approval letter. I called [employer]. Spoke w/ Nics P. She transferred me to dedicated team. Spoke w/ Lupita Leash. She will re-fax approval letter to [place] at [phone]. Total 5 pages.

## 2024-01-19 NOTE — Telephone Encounter (Signed)
 LVM for pt to call office.   Faxed approval letter to [place] at [phone]. Received fax confirmation.

## 2024-01-19 NOTE — Telephone Encounter (Signed)
 Marland Kitchen

## 2024-01-19 NOTE — Telephone Encounter (Signed)
 Called and spoke w/ pt. Relayed Actemra approved. Also relayed we received a fax from [place] that they were having trouble reaching her to discuss your ongoing eligibility for the Patient Foundation. Provided her their phone# to call: [phone]. She verbalized understanding.

## 2024-02-04 ENCOUNTER — Other Ambulatory Visit (HOSPITAL_COMMUNITY)
Admission: RE | Admit: 2024-02-04 | Discharge: 2024-02-04 | Disposition: A | Discharge status: Home / Self Care | Source: Ambulatory Visit | Attending: Obstetrics and Gynecology | Admitting: Obstetrics and Gynecology

## 2024-02-04 ENCOUNTER — Encounter: Payer: Self-pay | Admitting: Obstetrics and Gynecology

## 2024-02-04 ENCOUNTER — Ambulatory Visit (INDEPENDENT_AMBULATORY_CARE_PROVIDER_SITE_OTHER): Payer: 59 | Admitting: Obstetrics and Gynecology

## 2024-02-04 VITALS — BP 102/76 | HR 68 | Temp 98.3°F | Ht 65.0 in | Wt 153.0 lb

## 2024-02-04 DIAGNOSIS — Z01419 Encounter for gynecological examination (general) (routine) without abnormal findings: Secondary | ICD-10-CM | POA: Insufficient documentation

## 2024-02-04 DIAGNOSIS — Z124 Encounter for screening for malignant neoplasm of cervix: Secondary | ICD-10-CM

## 2024-02-04 DIAGNOSIS — Z113 Encounter for screening for infections with a predominantly sexual mode of transmission: Secondary | ICD-10-CM | POA: Insufficient documentation

## 2024-02-04 DIAGNOSIS — Z1331 Encounter for screening for depression: Secondary | ICD-10-CM

## 2024-02-04 NOTE — Progress Notes (Signed)
 23 y.o. G0P0000 female with multiple sclerosis, Myelin oligodendrocyte glycoprotein antibody disorder here for annual exam. Single. Peds RN, outpatient.  Patient's last menstrual period was 02/01/2024 (approximate). Period Duration (Days): 4-5 Period Pattern: Regular Menstrual Flow: Moderate Menstrual Control: Tampon, Maxi pad Dysmenorrhea: (!) Mild  Has intermittent urinary hesitancy, had one UTI last year with diagnosis MOGAD  Abnormal bleeding: none Pelvic discharge or pain: none Breast mass, nipple discharge or skin changes : none Birth control: abstinent Last PAP: No results found for: "DIAGPAP", "HPVHIGH", "ADEQPAP" Gardasil: completed with Peds Sexually active: no  Exercising: Yes. The patient has a physically strenuous job, but has no regular exercise apart from work.  Smoker: no   GYN HISTORY: No significant history  OB History  Gravida Para Term Preterm AB Living  0 0 0 0 0 0  SAB IAB Ectopic Multiple Live Births  0 0 0 0 0    Past Medical History:  Diagnosis Date   Seasonal allergies     History reviewed. No pertinent surgical history.  Current Outpatient Medications on File Prior to Visit  Medication Sig Dispense Refill   gabapentin (NEURONTIN) 400 MG capsule Take 1 capsule (400 mg total) by mouth 4 (four) times daily. 360 capsule 4   Multiple Vitamin (MULTI VITAMIN PO) Take by mouth.     sertraline (ZOLOFT) 100 MG tablet Take 1/2 tablet (50 mg total) by mouth daily. 90 tablet 4   Tocilizumab (ACTEMRA ACTPEN) 162 MG/0.9ML SOAJ Inject 162 mg into the skin once a week. 10.8 mL 3   Vitamin D, Ergocalciferol, (DRISDOL) 1.25 MG (50000 UNIT) CAPS capsule Take 1 capsule (50,000 Units total) by mouth every 7 (seven) days. 13 capsule 3   No current facility-administered medications on file prior to visit.    Social History   Socioeconomic History   Marital status: Single    Spouse name: Not on file   Number of children: Not on file   Years of education:  Not on file   Highest education level: High school graduate  Occupational History   Not on file  Tobacco Use   Smoking status: Never   Smokeless tobacco: Never  Substance and Sexual Activity   Alcohol use: Not Currently    Comment: Occasional   Drug use: Yes    Types: Marijuana    Comment: 1-2 a day   Sexual activity: Not Currently  Other Topics Concern   Not on file  Social History Narrative   Lives with parents and siblings   R handed   Caffeine: rarely    Social Drivers of Corporate investment banker Strain: Not on file  Food Insecurity: Not on file  Transportation Needs: Not on file  Physical Activity: Not on file  Stress: Not on file  Social Connections: Not on file  Intimate Partner Violence: Not on file    Family History  Problem Relation Age of Onset   Hypertension Father    Multiple sclerosis Maternal Great-grandmother     No Known Allergies    PE Today's Vitals   02/04/24 0931  BP: 102/76  Pulse: 68  Temp: 98.3 F (36.8 C)  TempSrc: Oral  SpO2: 99%  Weight: 153 lb (69.4 kg)  Height: 5\' 5"  (1.651 m)   Body mass index is 25.46 kg/m.  Physical Exam Vitals reviewed. Exam conducted with a chaperone present.  Constitutional:      General: She is not in acute distress.    Appearance: Normal appearance.  HENT:  Head: Normocephalic and atraumatic.     Nose: Nose normal.  Eyes:     Extraocular Movements: Extraocular movements intact.     Conjunctiva/sclera: Conjunctivae normal.  Neck:     Thyroid: No thyroid mass, thyromegaly or thyroid tenderness.  Pulmonary:     Effort: Pulmonary effort is normal.  Abdominal:     General: There is no distension.     Palpations: Abdomen is soft.     Tenderness: There is no abdominal tenderness.  Genitourinary:    General: Normal vulva.     Exam position: Lithotomy position.     Urethra: No prolapse.     Vagina: Normal. No vaginal discharge or bleeding.     Cervix: Normal. No lesion.     Uterus:  Normal. Not enlarged and not tender.      Adnexa: Right adnexa normal and left adnexa normal.  Musculoskeletal:        General: Normal range of motion.     Cervical back: Normal range of motion.  Lymphadenopathy:     Lower Body: No right inguinal adenopathy. No left inguinal adenopathy.  Skin:    General: Skin is warm and dry.  Neurological:     General: No focal deficit present.     Mental Status: She is alert.  Psychiatric:        Mood and Affect: Mood normal.        Behavior: Behavior normal.      Assessment and Plan:        Well woman exam with routine gynecological exam Assessment & Plan: Cervical cancer screening performed according to ASCCP guidelines. Labs and immunizations with her primary Encouraged safe sexual practices as indicated Encouraged healthy lifestyle practices with diet and exercise For patients under 50yo, I recommend 1000mg  calcium daily and 600IU of vitamin D daily.    Cervical cancer screening -     Cytology - PAP  Screen for STD (sexually transmitted disease) -     Cytology - PAP -     Hepatitis C antibody -     HIV Antibody (routine testing w rflx) -     RPR    Rosalyn Gess, MD

## 2024-02-04 NOTE — Patient Instructions (Signed)

## 2024-02-04 NOTE — Assessment & Plan Note (Signed)
 Cervical cancer screening performed according to ASCCP guidelines. Labs and immunizations with her primary Encouraged safe sexual practices as indicated Encouraged healthy lifestyle practices with diet and exercise For patients under 23yo, I recommend 1000mg  calcium daily and 600IU of vitamin D daily.

## 2024-02-05 ENCOUNTER — Encounter: Payer: Self-pay | Admitting: Obstetrics and Gynecology

## 2024-02-05 LAB — HEPATITIS C ANTIBODY: Hepatitis C Ab: NONREACTIVE

## 2024-02-05 LAB — HIV ANTIBODY (ROUTINE TESTING W REFLEX): HIV 1&2 Ab, 4th Generation: NONREACTIVE

## 2024-02-05 LAB — RPR: RPR Ser Ql: NONREACTIVE

## 2024-02-06 ENCOUNTER — Encounter: Payer: Self-pay | Admitting: Obstetrics and Gynecology

## 2024-02-06 LAB — CYTOLOGY - PAP
Chlamydia: NEGATIVE
Comment: NEGATIVE
Comment: NEGATIVE
Comment: NEGATIVE
Comment: NORMAL
Diagnosis: NEGATIVE
High risk HPV: NEGATIVE
Neisseria Gonorrhea: NEGATIVE
Trichomonas: NEGATIVE

## 2024-02-09 NOTE — Telephone Encounter (Signed)
 Routing to Dr. Kennith Center to review 02/04/24 pap results and advise.

## 2024-02-09 NOTE — Telephone Encounter (Signed)
 It only needs to be treated if she is experiencing discharge with foul odor. This not a sexually transmitted infection. Since she is experiencing symptoms, please offer vaginal or oral treatment: metrogel 0.75%, insert 1 Applicatorful vaginally at bedtime for 5 days, dispense 50g, no refills OR PO flagyl 500mg  BID x7d, dispense 14 tab, no refills.

## 2024-02-10 ENCOUNTER — Other Ambulatory Visit (HOSPITAL_COMMUNITY): Payer: Self-pay

## 2024-02-10 MED ORDER — METRONIDAZOLE 500 MG PO TABS
500.0000 mg | ORAL_TABLET | Freq: Two times a day (BID) | ORAL | 0 refills | Status: AC
  Filled 2024-02-10: qty 14, 7d supply, fill #0

## 2024-02-10 MED ORDER — METRONIDAZOLE 500 MG PO TABS: 500.0000 mg | ORAL_TABLET | Freq: Two times a day (BID) | ORAL | 0 refills | Status: DC

## 2024-02-10 NOTE — Telephone Encounter (Signed)
 Call placed to patient, Left message advising I will respond via MyChart, if you have additional questions, call [facility] Triage at [phone], option 4.   MyChart message to patient.

## 2024-02-10 NOTE — Addendum Note (Signed)
 Addended by: Leda Min on: 02/10/2024 02:02 PM   Modules accepted: Orders

## 2024-02-10 NOTE — Telephone Encounter (Signed)
 Flagyl Rx initially sent to [facility]. Change to Rx, resent to [facility] per patient request.   Routing to provider for final review. Patient is agreeable to disposition. Will close encounter.

## 2024-02-12 NOTE — Telephone Encounter (Signed)
 Spoke to patient this am made her aware [employer] patient foundation and faxed [place] to make [place] aware they have made several attempts to reach patient to discuss medication  Actemra . Pt states she was at work and will [employer] later today .

## 2024-03-18 ENCOUNTER — Other Ambulatory Visit: Payer: Self-pay

## 2024-03-18 ENCOUNTER — Encounter (HOSPITAL_COMMUNITY): Payer: Self-pay

## 2024-03-18 ENCOUNTER — Emergency Department (HOSPITAL_COMMUNITY)
Admission: EM | Admit: 2024-03-18 | Discharge: 2024-03-18 | Discharge status: Against Medical Advice | Attending: Emergency Medicine | Admitting: Emergency Medicine

## 2024-03-18 DIAGNOSIS — S3993XA Unspecified injury of pelvis, initial encounter: Secondary | ICD-10-CM | POA: Diagnosis not present

## 2024-03-18 DIAGNOSIS — J301 Allergic rhinitis due to pollen: Secondary | ICD-10-CM | POA: Diagnosis not present

## 2024-03-18 DIAGNOSIS — N941 Unspecified dyspareunia: Secondary | ICD-10-CM | POA: Insufficient documentation

## 2024-03-18 DIAGNOSIS — N939 Abnormal uterine and vaginal bleeding, unspecified: Secondary | ICD-10-CM | POA: Diagnosis not present

## 2024-03-18 DIAGNOSIS — Z5321 Procedure and treatment not carried out due to patient leaving prior to being seen by health care provider: Secondary | ICD-10-CM | POA: Insufficient documentation

## 2024-03-18 DIAGNOSIS — N390 Urinary tract infection, site not specified: Secondary | ICD-10-CM | POA: Diagnosis not present

## 2024-03-18 DIAGNOSIS — J029 Acute pharyngitis, unspecified: Secondary | ICD-10-CM | POA: Diagnosis not present

## 2024-03-18 NOTE — ED Notes (Signed)
 Pt called for vitals x2 with no answer

## 2024-03-18 NOTE — ED Triage Notes (Signed)
 Patient reports has sexual intercourse and thought she had a UTI and bruised cervix and had an exam and was told there was a lot of blood and a possible vaginal tear and needed to be evaluated.

## 2024-03-22 ENCOUNTER — Ambulatory Visit: Admitting: Obstetrics and Gynecology

## 2024-03-29 ENCOUNTER — Ambulatory Visit: Admitting: Obstetrics and Gynecology

## 2024-03-30 ENCOUNTER — Ambulatory Visit: Admitting: Obstetrics and Gynecology

## 2024-03-30 ENCOUNTER — Telehealth: Payer: Self-pay | Admitting: Neurology

## 2024-03-30 ENCOUNTER — Encounter: Payer: Self-pay | Admitting: Obstetrics and Gynecology

## 2024-03-30 VITALS — BP 102/64 | HR 78 | Wt 147.0 lb

## 2024-03-30 DIAGNOSIS — B9689 Other specified bacterial agents as the cause of diseases classified elsewhere: Secondary | ICD-10-CM

## 2024-03-30 DIAGNOSIS — N930 Postcoital and contact bleeding: Secondary | ICD-10-CM

## 2024-03-30 DIAGNOSIS — N760 Acute vaginitis: Secondary | ICD-10-CM | POA: Diagnosis not present

## 2024-03-30 DIAGNOSIS — Z113 Encounter for screening for infections with a predominantly sexual mode of transmission: Secondary | ICD-10-CM

## 2024-03-30 DIAGNOSIS — B009 Herpesviral infection, unspecified: Secondary | ICD-10-CM

## 2024-03-30 DIAGNOSIS — N898 Other specified noninflammatory disorders of vagina: Secondary | ICD-10-CM | POA: Diagnosis not present

## 2024-03-30 NOTE — Telephone Encounter (Signed)
 Patient said went to the pharmacy and was to account had been closed, Provider is not responding to them. Provider needed to call the health department. Would like a call from the nurse.

## 2024-03-30 NOTE — Progress Notes (Signed)
 23 y.o. 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 Single. Peds RN, outpatient.   Patient's last menstrual period was 03/08/2024 (approximate). Period Duration (Days): 5 Period Pattern: Regular Menstrual Flow: Moderate Menstrual Control: Maxi pad, Tampon Dysmenorrhea: (!) Mild  Sexually active with new partner. Some mild, pain noted after foreplay. No bleeding immediately after. She noticed VB 2 days following intercourse. Lasted for 2-3d. Light bleeding. No toy use.  She went to urgent care (April 24) for vaginal bleeding, pain and discomfort. They were concerned about a vaginal tear. No current symptoms as of today. Noticed vaginal discharge and itching a few days later. Symptoms have improved. Used boric acid for discharge.  No sex since. Wants repest STD testing since new partner.  Birth control: sometimes  GYN HISTORY: No sig hx  OB History  Gravida Para Term Preterm AB Living  0 0 0 0 0 0  SAB IAB Ectopic Multiple Live Births  0 0 0 0 0    Past Medical History:  Diagnosis Date   Seasonal allergies     No past surgical history on file.  Current Outpatient Medications on File Prior to Visit  Medication Sig Dispense Refill   gabapentin  (NEURONTIN ) 400 MG capsule Take 1 capsule (400 mg total) by mouth 4 (four) times daily. 360 capsule 4   Multiple Vitamin (MULTI VITAMIN PO) Take by mouth.     sertraline  (ZOLOFT ) 100 MG tablet Take 1/2 tablet (50 mg total) by mouth daily. 90 tablet 4   Tocilizumab  (ACTEMRA  ACTPEN) 162 MG/0.9ML SOAJ Inject 162 mg into the skin once a week. 10.8 mL 3   Vitamin D , Ergocalciferol , (DRISDOL ) 1.25 MG (50000 UNIT) CAPS capsule Take 1 capsule (50,000 Units total) by mouth every 7 (seven) days. 13 capsule 3   No current facility-administered medications on file prior to visit.    No Known Allergies    PE Today's Vitals   03/30/24 0836  BP: 102/64  Pulse: 78   SpO2: 99%  Weight: 147 lb (66.7 kg)   Body mass index is 24.46 kg/m.  Physical Exam Vitals reviewed. Exam conducted with a chaperone present.  Constitutional:      General: She is not in acute distress.    Appearance: Normal appearance.  HENT:     Head: Normocephalic and atraumatic.     Nose: Nose normal.  Eyes:     Extraocular Movements: Extraocular movements intact.     Conjunctiva/sclera: Conjunctivae normal.  Pulmonary:     Effort: Pulmonary effort is normal.  Genitourinary:    General: Normal vulva.     Exam position: Lithotomy position.     Vagina: Normal. No vaginal discharge.     Cervix: Normal. No cervical motion tenderness, discharge or lesion.     Uterus: Normal. Not enlarged and not tender.      Adnexa: Right adnexa normal and left adnexa normal.  Musculoskeletal:        General: Normal range of motion.     Cervical back: Normal range of motion.  Neurological:     General: No focal deficit present.     Mental Status: She is alert.  Psychiatric:        Mood and Affect: Mood normal.        Behavior: Behavior normal.      Assessment and Plan:        PCB (post coital bleeding) Likely mechanic in setting of new partner and intense foreplay Normal exam  today Discussed use of condoms. Offered contraception. Will f/u when she decided.  Screen for STD (sexually transmitted disease) -     Hepatitis B surface antigen -     Hepatitis C antibody -     RPR -     HIV Antibody (routine testing w rflx) -     HSV(herpes simplex vrs) 1+2 ab-IgG -     SureSwab Advanced Vaginitis Plus,TMA    Romaine Closs, MD

## 2024-03-30 NOTE — Telephone Encounter (Signed)
 Last rx sent to [facility] 05/12/23 qty 10.8 ml (90 days supply), 3 refills. They should have refills on file.   Called [facility] at [phone]. Transferred to [facility] pharmacy. They do not have an active rx on file. Spoke w/ Ethelle Herb. Account on hold. Cannot see why. I called [employer] back (option 6 for Actemra ) and spoke w/ Darren. Back in 12/2023 they received [place] approval info. Could not reach pt to reverify.  They LVM 12/2023 and LVM for alternate contact 01/2024. Never heard back from the patient. She needs to contact them still to discuss ongoing eligibility.   I called pt. Provided [employer] phone#. She will call back.    Last documentation:

## 2024-03-31 ENCOUNTER — Other Ambulatory Visit: Payer: Self-pay

## 2024-03-31 ENCOUNTER — Encounter: Payer: Self-pay | Admitting: Obstetrics and Gynecology

## 2024-03-31 ENCOUNTER — Other Ambulatory Visit: Payer: Self-pay | Admitting: *Deleted

## 2024-03-31 DIAGNOSIS — G3781 Myelin oligodendrocyte glycoprotein antibody disease: Secondary | ICD-10-CM

## 2024-03-31 LAB — SURESWAB® ADVANCED VAGINITIS PLUS,TMA
C. trachomatis RNA, TMA: NOT DETECTED
CANDIDA SPECIES: NOT DETECTED
Candida glabrata: NOT DETECTED
N. gonorrhoeae RNA, TMA: NOT DETECTED
SURESWAB(R) ADV BACTERIAL VAGINOSIS(BV),TMA: POSITIVE — AB
TRICHOMONAS VAGINALIS (TV),TMA: NOT DETECTED

## 2024-03-31 LAB — RPR: RPR Ser Ql: NONREACTIVE

## 2024-03-31 LAB — HIV ANTIBODY (ROUTINE TESTING W REFLEX): HIV 1&2 Ab, 4th Generation: NONREACTIVE

## 2024-03-31 LAB — HSV(HERPES SIMPLEX VRS) I + II AB-IGG
HSV 1 IGG,TYPE SPECIFIC AB: 6.14 {index} — ABNORMAL HIGH
HSV 2 IGG,TYPE SPECIFIC AB: 5.25 {index} — ABNORMAL HIGH

## 2024-03-31 LAB — HEPATITIS B SURFACE ANTIGEN: Hepatitis B Surface Ag: NONREACTIVE

## 2024-03-31 LAB — HEPATITIS C ANTIBODY: Hepatitis C Ab: NONREACTIVE

## 2024-03-31 MED ORDER — ACTEMRA ACTPEN 162 MG/0.9ML ~~LOC~~ SOAJ
162.0000 mg | SUBCUTANEOUS | 1 refills | Status: DC
  Filled 2024-04-06: qty 10.8, fill #0

## 2024-03-31 NOTE — Telephone Encounter (Signed)
 Pt has [employer]. [facility]. I sent rx refill for Actemra  here so they can start processing for pt.

## 2024-04-01 ENCOUNTER — Other Ambulatory Visit (HOSPITAL_COMMUNITY): Payer: Self-pay

## 2024-04-01 ENCOUNTER — Other Ambulatory Visit: Payer: Self-pay

## 2024-04-01 ENCOUNTER — Encounter: Payer: Self-pay | Admitting: Obstetrics and Gynecology

## 2024-04-01 DIAGNOSIS — B009 Herpesviral infection, unspecified: Secondary | ICD-10-CM | POA: Insufficient documentation

## 2024-04-01 MED ORDER — METRONIDAZOLE 500 MG PO TABS
500.0000 mg | ORAL_TABLET | Freq: Two times a day (BID) | ORAL | 0 refills | Status: AC
  Filled 2024-04-01: qty 14, 7d supply, fill #0

## 2024-04-01 NOTE — Addendum Note (Signed)
 Addended by: Romaine Closs on: 04/01/2024 04:02 PM   Modules accepted: Orders

## 2024-04-06 ENCOUNTER — Telehealth: Payer: Self-pay | Admitting: Neurology

## 2024-04-06 ENCOUNTER — Other Ambulatory Visit: Payer: Self-pay

## 2024-04-06 ENCOUNTER — Encounter (HOSPITAL_COMMUNITY): Payer: Self-pay

## 2024-04-06 ENCOUNTER — Other Ambulatory Visit (HOSPITAL_COMMUNITY): Payer: Self-pay

## 2024-04-06 NOTE — Telephone Encounter (Addendum)
 Copay is $692.00, PT did not qualify for patient assistance and she also did not qualify for the copay card due to her DX is an off label DX-PT has a High Deductible Health Plan with [facility] so will be responsible for the full amount of copay until deductible has been reached. Did you want to discuss with PT or would you like for me to continue with [facility] and call PT to collect copay?

## 2024-04-06 NOTE — Telephone Encounter (Signed)
 Called pt. She would like to come in to talk with Dr. Godwin Lat about other treatment options at this point. She has injection for this week and next week of Actemra  and then will be out. Scheduled appt for 04/12/24 at 3:30pm w/ Dr Godwin Lat (MD approved to work in). Cx appt 04/21/24 since she is coming in sooner.

## 2024-04-06 NOTE — Telephone Encounter (Signed)
 Dr. Godwin Lat- what would you like to do since cost  now high for pt and no assistance available? Did you want to bring in for appt to discuss other options?

## 2024-04-06 NOTE — Telephone Encounter (Signed)
 Appointment details confirmed

## 2024-04-08 ENCOUNTER — Encounter (HOSPITAL_COMMUNITY): Payer: Self-pay

## 2024-04-08 ENCOUNTER — Other Ambulatory Visit (HOSPITAL_COMMUNITY): Payer: Self-pay

## 2024-04-08 ENCOUNTER — Other Ambulatory Visit: Payer: Self-pay

## 2024-04-08 ENCOUNTER — Other Ambulatory Visit: Payer: Self-pay | Admitting: Neurology

## 2024-04-08 DIAGNOSIS — G3781 Myelin oligodendrocyte glycoprotein antibody disease: Secondary | ICD-10-CM

## 2024-04-08 NOTE — Telephone Encounter (Signed)
 Last seen on 10/08/23 Follow up scheduled on 04/12/24  Pt has refills on Rx already.

## 2024-04-08 NOTE — Telephone Encounter (Signed)
 Pt has called to confirm her appointment details again

## 2024-04-09 ENCOUNTER — Other Ambulatory Visit: Payer: Self-pay

## 2024-04-12 ENCOUNTER — Other Ambulatory Visit: Payer: Self-pay | Admitting: Neurology

## 2024-04-12 ENCOUNTER — Encounter: Payer: Self-pay | Admitting: Neurology

## 2024-04-12 ENCOUNTER — Ambulatory Visit (INDEPENDENT_AMBULATORY_CARE_PROVIDER_SITE_OTHER): Admitting: Neurology

## 2024-04-12 VITALS — BP 110/68 | HR 80 | Ht 65.0 in | Wt 149.0 lb

## 2024-04-12 DIAGNOSIS — Z0000 Encounter for general adult medical examination without abnormal findings: Secondary | ICD-10-CM | POA: Diagnosis not present

## 2024-04-12 DIAGNOSIS — F411 Generalized anxiety disorder: Secondary | ICD-10-CM | POA: Diagnosis not present

## 2024-04-12 DIAGNOSIS — G35 Multiple sclerosis: Secondary | ICD-10-CM

## 2024-04-12 DIAGNOSIS — Z79899 Other long term (current) drug therapy: Secondary | ICD-10-CM | POA: Diagnosis not present

## 2024-04-12 DIAGNOSIS — G379 Demyelinating disease of central nervous system, unspecified: Secondary | ICD-10-CM

## 2024-04-12 DIAGNOSIS — Z13220 Encounter for screening for lipoid disorders: Secondary | ICD-10-CM | POA: Diagnosis not present

## 2024-04-12 DIAGNOSIS — G3781 Myelin oligodendrocyte glycoprotein antibody disease: Secondary | ICD-10-CM

## 2024-04-12 NOTE — Progress Notes (Addendum)
 [facility]  PATIENT: Robin Mercer DOB: [birth year]  REFERRING DOCTOR OR PCP: Penney Bowling, MD SOURCE: Patient, notes from recent hospitalization, imaging and lab reports, multiple MRI images personally reviewed.  _________________________________   HISTORICAL  CHIEF COMPLAINT:  Chief Complaint  Patient presents with   Follow-up    Pt in room 11.alone. Here for MOGAD. Pt here to discuss new medication.    HISTORY OF PRESENT ILLNESS:  Robin Mercer is a 23 year old woman with MOGAD  UPDATE 04/12/2024: Addendum 04/28/2024: Lumbar puncture was performed at the end of May 2025.  CSF shows more than 5 oligoclonal bands in the CSF that were not present in the serum (positive OCB consistent with MS).  I called the patient to let her know the results.  She meets the McDonald criteria for definite relapsing remitting multiple sclerosis..  Due to her presentation with 1 large cervical spine and multiple smaller thoracic spine lesions, she has an aggressive multiple sclerosis.  Therefore, it is medically necessary that she be on a highly effective disease modifying therapy.  Specifically I would like her to go on one of the anti-CD20 agents, either Briumvi or Ocrevus.   We discussed her demyelinating disease.  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  Additionally, the last MRI showed an additional focus in the brain.  Anti-MOG has been low positive but she has never had a moderate to high positive reading.  Therefore, I think it is more likely that she has multiple sclerosis than MOGAD.  I would like to get her initiated on a disease modifying therapy.  Gait is better with improved strength and balance.  She has mild right leg weakness .  She has a right ankle stabilizer but rarely uses it.   She can walk a mile.  She mostly keeps up with others.    She has had no falls.     She  still has right leg numbness and tingling, also better since the last visit   Tingling comes in waves but is not painful.   Gabapentin  has helped.    Bladder hesitancy improved.     Vision is slightly worse.   She wears colors   Notes no change in color vision.     She notes no major issue with fatigue.    She sleeps well most nights.    She sometimes has mild depressed.   She feels cognition is doing well.    She is in school for nursing.     History of demyelinating disease: She had right calf weakness starting in spring 2020.  She had a couple other times she noted leg symptoms.    The onset was gradual and she was fairly stable the next 2 - 3 years.    In May 2023 she had the onset of numbness in her right leg and gait worsened.  This was worse than there episodes.    The paresthesias worsened when she walked further.   She also had urinary hesitancy.   Due to the symptoms, she went to the ED and was admitted for 5 days of IV Solumedrol.   She felt she improved initially but symptoms are worse now than at discharge.    Imaging showed an LETM in cervical spinal cord and discrete thracic foci  She ran track in high school and had no symptoms before age 52.    Imaging  reviewed: MRI of the cervical  and thoracic spine 10/20/2024 was unchanged (LETM C1-C2 to C7) and smaller foci in thoracic spine.      MRI of the brain 04/11/2022 showed multiple T2/FLAIR hyperintense foci in the periventricular, juxtacortical and deep white matter.  None of the foci enhanced after contrast.  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  MRI of the lumbar spine 04/11/2022: There are T2 hyperintense foci adjacent to T10 and adjacent to T11-T12 (posterior).  They do not enhance.  MRI of the cervical and thoracic spine 04/13/2022 shows a longitudinally extensive T2 hyperintense focus from C1-C2 caudally to C7.  Additionally, there are T2 hyperintense foci within the thoracic spinal cord adjacent to T4, T7, T10 and T12.  None of the foci  enhance.  No significant degenerative change.  Laboratory: 03/2022:  JCV DNA PCR negative.  HIV negative  04/01/2023 anti-MOG was 1:32  REVIEW OF SYSTEMS: Constitutional: No fevers, chills, sweats, or change in appetite Eyes: No visual changes, double vision, eye pain Ear, nose and throat: No hearing loss, ear pain, nasal congestion, sore throat Cardiovascular: No chest pain, palpitations Respiratory:  No shortness of breath at rest or with exertion.   No wheezes GastrointestinaI: No nausea, vomiting, diarrhea, abdominal pain, fecal incontinence Genitourinary:  No dysuria, urinary retention or frequency.  No nocturia. Musculoskeletal:  No neck pain, back pain Integumentary: No rash, pruritus, skin lesions Neurological: as above Psychiatric: No depression at this time.  No anxiety Endocrine: No palpitations, diaphoresis, change in appetite, change in weigh or increased thirst Hematologic/Lymphatic:  No anemia, purpura, petechiae. Allergic/Immunologic: No itchy/runny eyes, nasal congestion, recent allergic reactions, rashes  ALLERGIES: No Known Allergies  HOME MEDICATIONS:  Current Outpatient Medications:    gabapentin  (NEURONTIN ) 400 MG capsule, Take 1 capsule (400 mg total) by mouth 4 (four) times daily., Disp: 360 capsule, Rfl: 4   Multiple Vitamin (MULTI VITAMIN PO), Take by mouth., Disp: , Rfl:    sertraline  (ZOLOFT ) 100 MG tablet, Take 1/2 tablet (50 mg total) by mouth daily., Disp: 90 tablet, Rfl: 4   Tocilizumab  (ACTEMRA  ACTPEN) 162 MG/0.9ML SOAJ, Inject 162 mg into the skin once a week., Disp: 10.8 mL, Rfl: 1   Vitamin D , Ergocalciferol , (DRISDOL ) 1.25 MG (50000 UNIT) CAPS capsule, Take 1 capsule (50,000 Units total) by mouth every 7 (seven) days., Disp: 13 capsule, Rfl: 3  PAST MEDICAL HISTORY: Past Medical History:  Diagnosis Date   Seasonal allergies     PAST SURGICAL HISTORY: History reviewed. No pertinent surgical history.  FAMILY HISTORY: Family History   Problem Relation Age of Onset   Hypertension Father    Multiple sclerosis Maternal Great-grandmother     SOCIAL HISTORY:  Social History   Socioeconomic History   Marital status: Single    Spouse name: Not on file   Number of children: Not on file   Years of education: Not on file   Highest education level: High school graduate  Occupational History   Not on file  Tobacco Use   Smoking status: Never   Smokeless tobacco: Never  Vaping Use   Vaping status: Never Used  Substance and Sexual Activity   Alcohol use: Not Currently   Drug use: Yes    Types: Marijuana    Comment: 1-2 a day   Sexual activity: Not Currently  Other Topics Concern   Not on file  Social History Narrative   Lives with parents and siblings   R handed   Caffeine: rarely    Social Drivers  of Health   Financial Resource Strain: Not on file  Food Insecurity: Not on file  Transportation Needs: Not on file  Physical Activity: Not on file  Stress: Not on file  Social Connections: Not on file  Intimate Partner Violence: Not on file     PHYSICAL EXAM  Vitals:   04/12/24 1524  BP: 110/68  Pulse: 80  Weight: 149 lb (67.6 kg)  Height: 5\' 5"  (1.651 m)    Body mass index is 24.79 kg/m.  VA:   OS 20/30   OD 20/20   Colors symmetric  General: The patient is well-developed and well-nourished and in no acute distress  HEENT:  Head is Fox/AT.  Sclera are anicteric.    Skin: Extremities are without rash or  edema.  Musculoskeletal:  Back is nontender  Neurologic Exam  Mental status: The patient is alert and oriented x 3 at the time of the examination. The patient has apparent normal recent and remote memory, with an apparently normal attention span and concentration ability.   Speech is normal.  Cranial nerves: Extraocular movements are full. Pupils are equal, round, and reactive to light and accomodation. Color vision was symmetric.  There is good facial sensation to soft touch  bilaterally.Facial strength is normal.  Trapezius and sternocleidomastoid strength is normal. No dysarthria is noted.   No obvious hearing deficits are noted.  Motor:  Muscle bulk is normal.   Tone is slightly increased in legs. Strength is  5 / 5 in all 4 extremities.   Sensory: Sensory testing is intact to pinprick, soft touch and vibration sensation in all 4 extremities.  Coordination: Cerebellar testing reveals good finger-nose-finger and reduced right heel-to-shin .  Gait and station: Station is normal.   Gait is mildly right spastic and mildly wide . Tandem gait is poor. Romberg is negative.   Reflexes: Deep tendon reflexes are increased in legs, right > left (spread 2 beats nonsustained clonus) and normal bilaterally.   Plantar responses are flexor left and extensor right.    DIAGNOSTIC DATA (LABS, IMAGING, TESTING) - I reviewed patient records, labs, notes, testing and imaging myself where available.  Lab Results  Component Value Date   WBC 6.2 04/01/2023   HGB 13.8 04/01/2023   HCT 41.6 04/01/2023   MCV 90 04/01/2023   PLT 292 04/01/2023      Component Value Date/Time   NA 140 04/01/2023 1345   K 4.1 04/01/2023 1345   CL 104 04/01/2023 1345   CO2 21 04/01/2023 1345   GLUCOSE 86 04/01/2023 1345   GLUCOSE 126 (H) 04/15/2022 0143   BUN 7 04/01/2023 1345   CREATININE 0.77 04/01/2023 1345   CALCIUM 9.4 04/01/2023 1345   PROT 7.0 04/01/2023 1345   ALBUMIN 4.4 04/01/2023 1345   AST 15 04/01/2023 1345   ALT 15 04/01/2023 1345   ALKPHOS 52 04/01/2023 1345   BILITOT 0.4 04/01/2023 1345   GFRNONAA >60 04/15/2022 0143    Lab Results  Component Value Date   TSH 0.937 04/11/2022       ASSESSMENT AND PLAN  Multiple sclerosis (HCC) - Plan: QuantiFERON-TB Gold Plus, IgG, IgA, IgM  High risk medication use - Plan: QuantiFERON-TB Gold Plus, IgG, IgA, IgM  Demyelinating disease of central nervous system (HCC) - Plan: DG FL GUIDED LUMBAR PUNCTURE   She has unusual  MRIs and that she has 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  Because the MRI  of the brain performed a few months ago showed an additional lesion, I think it is more likely that she has MS than MOGAD.  Due to her multiple plaques in the spinal cord, we need to initiate a highly effective disease modifying therapy and we discussed the anti-CD20 agents.  Will also check the CSF for oligoclonal bands to definitively diagnose her relapsing remitting MS She has a right foot drop.  Wear ankle support Stay active and exercise as tolerated Continue gabapentin . Rtc 6 months, sooner if new or worsening neurologic symptom.  Addendum 04/28/2024: Lumbar puncture was performed at the end of May 2025.  CSF shows more than 5 oligoclonal bands in the CSF that were not present in the serum (positive OCB consistent with MS).  I called the patient to let her know the results.  She meets the McDonald criteria for definite relapsing remitting multiple sclerosis..  Due to her presentation with 1 large cervical spine and multiple smaller thoracic spine lesions and weakness at onset, she has an aggressive multiple sclerosis.  Therefore, it is medically necessary that she be on a highly effective disease modifying therapy.  Specifically I would like her to go on one of the anti-CD20 agents, either Briumvi or Ocrevus.  Jerzy Crotteau A. Godwin Lat, MD, [facility] 04/12/2024, 5:43 PM Certified in Neurology, Clinical Neurophysiology, Sleep Medicine and Neuroimaging  [facility] [address], [address] [place], [place] [address] [phone]

## 2024-04-13 ENCOUNTER — Encounter (HOSPITAL_COMMUNITY): Payer: Self-pay

## 2024-04-13 ENCOUNTER — Encounter: Payer: Self-pay | Admitting: Neurology

## 2024-04-13 ENCOUNTER — Other Ambulatory Visit: Payer: Self-pay

## 2024-04-13 ENCOUNTER — Other Ambulatory Visit (HOSPITAL_COMMUNITY): Payer: Self-pay

## 2024-04-13 DIAGNOSIS — G3781 Myelin oligodendrocyte glycoprotein antibody disease: Secondary | ICD-10-CM

## 2024-04-13 MED ORDER — GABAPENTIN 400 MG PO CAPS
400.0000 mg | ORAL_CAPSULE | Freq: Four times a day (QID) | ORAL | 1 refills | Status: DC
  Filled 2024-04-13 – 2024-04-23 (×2): qty 360, 90d supply, fill #0
  Filled 2024-07-20: qty 360, 90d supply, fill #1

## 2024-04-13 MED ORDER — SERTRALINE HCL 100 MG PO TABS
50.0000 mg | ORAL_TABLET | Freq: Every day | ORAL | 1 refills | Status: AC
  Filled 2024-04-13 – 2024-04-23 (×2): qty 45, 90d supply, fill #0
  Filled 2024-07-20: qty 45, 90d supply, fill #1
  Filled 2024-12-15: qty 45, 90d supply, fill #2

## 2024-04-14 ENCOUNTER — Other Ambulatory Visit: Payer: Self-pay

## 2024-04-14 NOTE — Telephone Encounter (Signed)
 Pharmacy reached out to me to see if PT will be continuing this medication-I believe Dr. Godwin Lat was discussing changing meds-it is still active on medlist but I believe the copay was very high as well. Please advise.

## 2024-04-14 NOTE — Telephone Encounter (Signed)
 Correct, she will be changing therapy. We are waiting on lab results prior to deciding on which therapy to change her to. She will be d/c'ing Actemra . Thank you

## 2024-04-14 NOTE — Progress Notes (Signed)
 Patient will be changing therapy. Office Is waiting on labs results prior to deciding what therapy to change her to. Dis-enrolling.

## 2024-04-15 LAB — QUANTIFERON-TB GOLD PLUS
QuantiFERON Mitogen Value: >10 [IU]/mL
QuantiFERON Nil Value: 0.09 [IU]/mL
QuantiFERON TB1 Ag Value: 0.11 [IU]/mL
QuantiFERON TB2 Ag Value: 0.12 [IU]/mL
QuantiFERON-TB Gold Plus: NEGATIVE

## 2024-04-15 LAB — IGG, IGA, IGM
IgA/Immunoglobulin A, Serum: 171 mg/dL (ref 87–352)
IgG (Immunoglobin G), Serum: 1631 mg/dL — ABNORMAL HIGH (ref 586–1602)
IgM (Immunoglobulin M), Srm: 130 mg/dL (ref 26–217)

## 2024-04-18 ENCOUNTER — Encounter: Payer: Self-pay | Admitting: Neurology

## 2024-04-20 ENCOUNTER — Ambulatory Visit: Payer: Self-pay | Admitting: Neurology

## 2024-04-20 NOTE — Discharge Instructions (Signed)

## 2024-04-21 ENCOUNTER — Other Ambulatory Visit (HOSPITAL_COMMUNITY): Payer: Self-pay

## 2024-04-21 ENCOUNTER — Ambulatory Visit: Payer: 59 | Admitting: Neurology

## 2024-04-21 ENCOUNTER — Ambulatory Visit
Admission: RE | Admit: 2024-04-21 | Discharge: 2024-04-21 | Disposition: A | Discharge status: Home / Self Care | Source: Ambulatory Visit | Attending: Neurology | Admitting: Neurology

## 2024-04-21 VITALS — BP 112/64 | HR 58

## 2024-04-21 DIAGNOSIS — G379 Demyelinating disease of central nervous system, unspecified: Secondary | ICD-10-CM | POA: Diagnosis not present

## 2024-04-21 NOTE — Progress Notes (Signed)
1 vial of blood drawn from pts Left AC to be sent off with LP lab work. 1 successful attempt, pt tolerated well. Gauze and tape applied after.

## 2024-04-23 ENCOUNTER — Other Ambulatory Visit (HOSPITAL_COMMUNITY): Payer: Self-pay

## 2024-04-26 ENCOUNTER — Encounter: Payer: Self-pay | Admitting: Neurology

## 2024-04-27 LAB — CSF CELL COUNT WITH DIFFERENTIAL
RBC Count, CSF: 3 {cells}/uL — ABNORMAL HIGH
TOTAL NUCLEATED CELL: 3 {cells}/uL (ref 0–5)

## 2024-04-27 LAB — CNS IGG SYNTHESIS RATE, CSF+BLOOD
Albumin Serum: 3.9 g/dL (ref 3.6–5.1)
Albumin, CSF: 12.5 mg/dL (ref 8.0–42.0)
CNS-IgG Synthesis Rate: -1.6 mg/(24.h) (ref <=3.3)
IgG (Immunoglobin G), Serum: 1540 mg/dL (ref 600–1640)
IgG Total CSF: 3.1 mg/dL (ref 0.8–7.7)
IgG-Index: 0.63 (ref <0.70)

## 2024-04-27 LAB — OLIGOCLONAL BANDS, CSF + SERM

## 2024-04-27 LAB — PROTEIN, CSF: Total Protein, CSF: 25 mg/dL (ref 15–45)

## 2024-04-27 LAB — GLUCOSE, CSF: Glucose, CSF: 51 mg/dL (ref 40–80)

## 2024-04-28 ENCOUNTER — Telehealth: Payer: Self-pay

## 2024-04-28 NOTE — Telephone Encounter (Signed)
 We finally got the oligoclonal bands result.  They are positive showing greater than 5 bands in the CSF that are not in the serum.  With this we can be more certain of the diagnosis of relapsing remitting MS.  Due to the aggressiveness of her presentation with a large cervical spinal cord lesion and multiple thoracic spine lesions as well as foci in the brain, it is medically necessary that she be placed on a highly effective disease modifying therapy.  We had discussed options and I believe an anti-CD20 agent such as Briumvi or Ocrevus will be her best option.  She had signed the service request forms and we will try to get her authorized for 1 of these 2 drugs.  Labs for chronic infections were negative  I called her to go over the CSF results and the neck steps

## 2024-04-28 NOTE — Telephone Encounter (Signed)
 Called pt and asked her for her household size and annual household income. Finished pt's Ocrevus start form.

## 2024-04-29 ENCOUNTER — Telehealth: Payer: Self-pay | Admitting: *Deleted

## 2024-04-29 NOTE — Telephone Encounter (Signed)
 Dr.Sater spoke with patient about her labs,  he asked me to fax Briumvi form. Start form faxed, confirmation received. (See 04/26/24 patient message)  Briumvi order given to [facility], Dr.Sater wanted to start with Briumvi first and Ocrevus if no approval for Briumiv.

## 2024-05-11 NOTE — Telephone Encounter (Signed)
 Faxed complete/signed Ocrevus start form to [employer] at [phone]. Received fax confirmation. Gave signed order to [facility].

## 2024-05-11 NOTE — Telephone Encounter (Signed)
 Briumvi denied, going to try and get Ocrevus approved instead.

## 2024-05-18 ENCOUNTER — Encounter: Payer: Self-pay | Admitting: Neurology

## 2024-05-18 NOTE — Telephone Encounter (Signed)
 Briumvi was Denied 05/11/24 Ocrevus Start form was sent in 05/11/24

## 2024-05-19 ENCOUNTER — Other Ambulatory Visit: Payer: Self-pay

## 2024-05-19 ENCOUNTER — Telehealth: Payer: Self-pay

## 2024-05-19 ENCOUNTER — Other Ambulatory Visit (HOSPITAL_COMMUNITY): Payer: Self-pay

## 2024-05-19 MED ORDER — PREDNISONE 10 MG (21) PO TBPK
ORAL_TABLET | ORAL | 0 refills | Status: AC
  Filled 2024-05-19: qty 21, 6d supply, fill #0

## 2024-05-19 NOTE — Telephone Encounter (Signed)
 Pt's Ocrevus has been Approved

## 2024-05-19 NOTE — Telephone Encounter (Signed)
 LVM asking pt what pharmacy she is using b/c Dr. Vear wants to send her in some Prednisone  for her discomfort.

## 2024-05-20 ENCOUNTER — Encounter: Payer: Self-pay | Admitting: Neurology

## 2024-05-21 ENCOUNTER — Encounter: Payer: Self-pay | Admitting: Neurology

## 2024-05-25 NOTE — Telephone Encounter (Signed)
 Per Kim/[facility] 05/20/24 1431:  I spoke with my [name] about this pt and she is going to get involved and switch her to [facility] and do her best to escalate this

## 2024-06-02 NOTE — Telephone Encounter (Signed)
 Per Holly/[facility]: Pt scheduled 7/14 at 8am and 7/28 10am

## 2024-06-06 ENCOUNTER — Encounter: Payer: Self-pay | Admitting: Neurology

## 2024-06-07 ENCOUNTER — Encounter: Payer: Self-pay | Admitting: Neurology

## 2024-06-07 DIAGNOSIS — G35 Multiple sclerosis: Secondary | ICD-10-CM | POA: Diagnosis not present

## 2024-06-08 ENCOUNTER — Encounter: Payer: Self-pay | Admitting: *Deleted

## 2024-06-21 DIAGNOSIS — G35 Multiple sclerosis: Secondary | ICD-10-CM | POA: Diagnosis not present

## 2024-07-06 ENCOUNTER — Encounter: Payer: Self-pay | Admitting: Neurology

## 2024-07-07 DIAGNOSIS — U071 COVID-19: Secondary | ICD-10-CM | POA: Diagnosis not present

## 2024-07-22 ENCOUNTER — Other Ambulatory Visit (HOSPITAL_COMMUNITY): Payer: Self-pay

## 2024-09-20 ENCOUNTER — Other Ambulatory Visit (HOSPITAL_COMMUNITY): Payer: Self-pay

## 2024-09-20 ENCOUNTER — Other Ambulatory Visit: Payer: Self-pay | Admitting: Neurology

## 2024-09-20 ENCOUNTER — Encounter: Payer: Self-pay | Admitting: Neurology

## 2024-09-20 ENCOUNTER — Encounter (HOSPITAL_COMMUNITY): Payer: Self-pay

## 2024-09-20 MED ORDER — ARMODAFINIL 200 MG PO TABS
200.0000 mg | ORAL_TABLET | Freq: Every morning | ORAL | 5 refills | Status: AC
  Filled 2024-09-20: qty 30, 30d supply, fill #0
  Filled 2024-10-16 – 2024-10-19 (×2): qty 30, 30d supply, fill #1
  Filled 2024-10-23 – 2024-12-15 (×2): qty 30, 30d supply, fill #2

## 2024-10-16 ENCOUNTER — Other Ambulatory Visit: Payer: Self-pay | Admitting: Neurology

## 2024-10-16 DIAGNOSIS — G3781 Myelin oligodendrocyte glycoprotein antibody disease: Secondary | ICD-10-CM

## 2024-10-18 ENCOUNTER — Other Ambulatory Visit (HOSPITAL_COMMUNITY): Payer: Self-pay

## 2024-10-18 ENCOUNTER — Other Ambulatory Visit: Payer: Self-pay

## 2024-10-18 MED ORDER — VITAMIN D (ERGOCALCIFEROL) 1.25 MG (50000 UNIT) PO CAPS
50000.0000 [IU] | ORAL_CAPSULE | ORAL | 3 refills | Status: AC
  Filled 2024-10-18: qty 13, 91d supply, fill #0
  Filled 2024-10-23: qty 13, 91d supply, fill #1
  Filled 2024-12-15: qty 12, 84d supply, fill #1

## 2024-10-18 MED ORDER — GABAPENTIN 400 MG PO CAPS
400.0000 mg | ORAL_CAPSULE | Freq: Four times a day (QID) | ORAL | 1 refills | Status: AC
  Filled 2024-10-18: qty 360, 90d supply, fill #0
  Filled 2024-12-15 – 2024-12-22 (×2): qty 360, 90d supply, fill #1
  Filled ????-??-??: fill #1

## 2024-10-19 ENCOUNTER — Other Ambulatory Visit (HOSPITAL_COMMUNITY): Payer: Self-pay

## 2024-10-24 ENCOUNTER — Other Ambulatory Visit (HOSPITAL_COMMUNITY): Payer: Self-pay

## 2024-11-04 ENCOUNTER — Encounter: Payer: Self-pay | Admitting: Neurology

## 2024-11-04 ENCOUNTER — Ambulatory Visit: Payer: Self-pay | Admitting: Neurology

## 2024-11-04 VITALS — BP 116/75 | HR 80 | Ht 66.5 in | Wt 142.0 lb

## 2024-11-04 DIAGNOSIS — M21371 Foot drop, right foot: Secondary | ICD-10-CM | POA: Diagnosis not present

## 2024-11-04 DIAGNOSIS — R269 Unspecified abnormalities of gait and mobility: Secondary | ICD-10-CM

## 2024-11-04 DIAGNOSIS — G35A Relapsing-remitting multiple sclerosis: Secondary | ICD-10-CM

## 2024-11-04 DIAGNOSIS — G379 Demyelinating disease of central nervous system, unspecified: Secondary | ICD-10-CM

## 2024-11-04 DIAGNOSIS — Z79899 Other long term (current) drug therapy: Secondary | ICD-10-CM | POA: Diagnosis not present

## 2024-11-04 DIAGNOSIS — G3781 Myelin oligodendrocyte glycoprotein antibody disease: Secondary | ICD-10-CM | POA: Diagnosis not present

## 2024-11-04 NOTE — Progress Notes (Signed)
 [facility]  PATIENT: Robin Mercer DOB: Sep 28, 2001  REFERRING DOCTOR OR PCP: Starleen Longest, MD SOURCE: Patient, notes from recent hospitalization, imaging and lab reports, multiple MRI images personally reviewed.  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   She had her split dose and next one will be in January.  She has features of both multiple sclerosis (classic brain lesions, positive oligoclonal bands) and MOGAD (anti-MOG antibodies and LETM).   Therefore, I switched her from Actemra  to Ocrevus  to place on a medication that would be beneficial for both diseases.    We discussed her demyelinating disease.  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  Additionally, the last MRI showed an additional focus in the brain.  Anti-MOG has been low positive but she has never had a moderate to high positive reading.  Therefore, I think it is more likely that she has multiple sclerosis than MOGAD.  I would like to get her initiated on a disease modifying therapy.  She feels gait is doing a little better.   Improved strength and balance.  She has mild right leg weakness . Also some right hand weakness.     She can walk better and [place] - after walking a couple hours, she had one fall when going back to her car.  Not quite keeping up with others on long walks.  She has right leg numbness and tingling, also better since the last visit   Tingling is uncomfortable   Gabapentin  400 mg po qid has helped.    Bladder hesitancy improved.      Vision is slightly worse.    Notes no change in color vision.     She notes no major issue with fatigue.    She sleeps well most nights.    She sometimes has mild depressed.   She has more anxiety - both helped by sertraline .  She feels cognition is doing well.    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   History of demyelinating disease: She had right calf weakness starting in spring 2020.  She had a couple other times she noted leg symptoms.    The onset was gradual and she was fairly stable the next 2 - 3 years.    In May 2023 she had the onset of numbness in her right leg and gait worsened.  This was worse than there episodes.    The paresthesias worsened when she walked further.   She also had urinary hesitancy.   Due to the symptoms, she went to the ED and was admitted for 5 days of IV Solumedrol.   She felt she improved initially but symptoms are worse now than at discharge.    Imaging showed an LETM in cervical spinal cord and discrete thracic foci.    Lumbar puncture was performed at the end of May 2025.  CSF shows more than 5 oligoclonal bands in the CSF that were not present in the serum (positive OCB consistent with MS).  I called the patient to let her know  the results.  She meets the McDonald criteria for definite relapsing remitting multiple sclerosis..  Initially she was placed on Actemra  due to the anti-MOG antibody/MOGAD.  With further evidence that she could have an overlap syndrome or an unusual MS, she was switched to Ocrevus  July 2025   She ran track in high school and had no symptoms before age 65.    Imaging reviewed: MRI of the cervical  and thoracic spine 11/26/20254 was unchanged (LETM C1-C2 to C7) and smaller foci in thoracic spine.     MRi brain 10/21/2023 showed Multiple T2/FLAIR hyperintense foci in the cerebral hemispheres and upper cervical spinal cord. These are consistent with chronic demyelinating lesions as could be seen with multiple sclerosis or  other etiologies. None of the foci appear to be acute. However, 1 small focus in the left periatrial white matter seen on the current MRI was not clearly observed on the 2023 MRI.   MRI of the brain 04/11/2022 showed multiple T2/FLAIR hyperintense foci in the periventricular, juxtacortical and deep white matter.  None of the foci enhanced after contrast.  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  MRI of the lumbar spine 04/11/2022: There are T2 hyperintense foci adjacent to T10 and adjacent to T11-T12 (posterior).  They do not enhance.  MRI of the cervical and thoracic spine 04/13/2022 shows a longitudinally extensive T2 hyperintense focus from C1-C2 caudally to C7.  Additionally, there are T2 hyperintense foci within the thoracic spinal cord adjacent to T4, T7, T10 and T12.  None of the foci enhance.  No significant degenerative change.  Laboratory: 03/2022:  JCV DNA PCR negative.  HIV negative  04/01/2023 anti-MOG was 1:32  REVIEW OF SYSTEMS: Constitutional: No fevers, chills, sweats, or change in appetite Eyes: No visual changes, double vision, eye pain Ear, nose and throat: No hearing loss, ear pain, nasal congestion, sore throat Cardiovascular: No chest pain, palpitations Respiratory:  No shortness of breath at rest or with exertion.   No wheezes GastrointestinaI: No nausea, vomiting, diarrhea, abdominal pain, fecal incontinence Genitourinary:  No dysuria, urinary retention or frequency.  No nocturia. Musculoskeletal:  No neck pain, back pain Integumentary: No rash, pruritus, skin lesions Neurological: as above Psychiatric: No depression at this time.  No anxiety Endocrine: No palpitations, diaphoresis, change in appetite, change in weigh or increased thirst Hematologic/Lymphatic:  No anemia, purpura, petechiae. Allergic/Immunologic: No itchy/runny eyes, nasal congestion, recent allergic reactions, rashes  ALLERGIES: No Known Allergies  HOME MEDICATIONS:  Current Outpatient Medications:     Armodafinil  200 MG TABS, Take 1 tablet (200 mg total) by mouth in the morning., Disp: 30 tablet, Rfl: 5   gabapentin  (NEURONTIN ) 400 MG capsule, Take 1 capsule (400 mg total) by mouth 4 (four) times daily., Disp: 360 capsule, Rfl: 1   sertraline  (ZOLOFT ) 100 MG tablet, Take 1/2 tablet (50 mg total) by mouth daily., Disp: 90 tablet, Rfl: 1   Vitamin D , Ergocalciferol , (DRISDOL ) 1.25 MG (50000 UNIT) CAPS capsule, Take 1 capsule (50,000 Units total) by mouth every 7 (seven) days., Disp: 13 capsule, Rfl: 3  PAST MEDICAL HISTORY: Past Medical History:  Diagnosis Date   Seasonal allergies     PAST SURGICAL HISTORY: History reviewed. No pertinent surgical history.  FAMILY HISTORY: Family History  Problem Relation Age of Onset   Hypertension Father    Multiple sclerosis Maternal Great-grandmother     SOCIAL HISTORY:  Social History   Socioeconomic History   Marital status: Single    Spouse name: Not on file  Number of children: Not on file   Years of education: Not on file   Highest education level: High school graduate  Occupational History   Not on file  Tobacco Use   Smoking status: Never   Smokeless tobacco: Never  Vaping Use   Vaping status: Never Used  Substance and Sexual Activity   Alcohol use: Not Currently   Drug use: Yes    Types: Marijuana    Comment: 1-2 a day   Sexual activity: Not Currently  Other Topics Concern   Not on file  Social History Narrative   Lives with parents and siblings   R handed   Caffeine: rarely    Social Drivers of Health   Tobacco Use: Low Risk (11/04/2024)   Patient History    Smoking Tobacco Use: Never    Smokeless Tobacco Use: Never    Passive Exposure: Not on file  Financial Resource Strain: Not on file  Food Insecurity: Not on file  Transportation Needs: Not on file  Physical Activity: Not on file  Stress: Not on file  Social Connections: Not on file  Intimate Partner Violence: Not on file  Depression (PHQ2-9): Low  Risk (02/04/2024)   Depression (PHQ2-9)    PHQ-2 Score: 0  Alcohol Screen: Not on file  Housing: Not on file  Utilities: Not on file  Health Literacy: Not on file     PHYSICAL EXAM  Vitals:   11/04/24 1015  BP: 116/75  Pulse: 80  Weight: 142 lb (64.4 kg)  Height: 5' 6.5 (1.689 m)    Body mass index is 22.58 kg/m.  VA:   OS 20/30   OD 20/20   Colors symmetric  General: The patient is well-developed and well-nourished and in no acute distress  HEENT:  Head is [place]/AT.  Sclera are anicteric.    Skin: Extremities are without rash or  edema.  Musculoskeletal:  Back is nontender  Neurologic Exam  Mental status: The patient is alert and oriented x 3 at the time of the examination. The patient has apparent normal recent and remote memory, with an apparently normal attention span and concentration ability.   Speech is normal.  Cranial nerves: Extraocular movements are full. Pupils are equal, round, and reactive to light and accomodation. Color vision was symmetric.  There is good facial sensation to soft touch bilaterally.Facial strength is normal.  Trapezius and sternocleidomastoid strength is normal. No dysarthria is noted.   No obvious hearing deficits are noted.  Motor:  Muscle bulk is normal.   Tone is slightly increased in legs. Strength is  5 / 5 in all 4 extremities.   Sensory: Sensory testing is intact to pinprick, soft touch and vibration sensation in all 4 extremities.  Coordination: Cerebellar testing reveals good finger-nose-finger and reduced right heel-to-shin .  Gait and station: Station is normal.   Gait is mildly right spastic and mildly wide . Tandem gait is poor. Romberg is negative.   Reflexes: Deep tendon reflexes are increased in legs, right > left (spread 2 beats nonsustained clonus) and normal bilaterally.   Plantar responses are flexor left and extensor right.    DIAGNOSTIC DATA (LABS, IMAGING, TESTING) - I reviewed patient records, labs, notes,  testing and imaging myself where available.  Lab Results  Component Value Date   WBC 6.2 04/01/2023   HGB 13.8 04/01/2023   HCT 41.6 04/01/2023   MCV 90 04/01/2023   PLT 292 04/01/2023      Component Value Date/Time  NA 140 04/01/2023 1345   K 4.1 04/01/2023 1345   CL 104 04/01/2023 1345   CO2 21 04/01/2023 1345   GLUCOSE 86 04/01/2023 1345   GLUCOSE 126 (H) 04/15/2022 0143   BUN 7 04/01/2023 1345   CREATININE 0.77 04/01/2023 1345   CALCIUM 9.4 04/01/2023 1345   PROT 7.0 04/01/2023 1345   ALBUMIN 3.9 04/21/2024 1330   AST 15 04/01/2023 1345   ALT 15 04/01/2023 1345   ALKPHOS 52 04/01/2023 1345   BILITOT 0.4 04/01/2023 1345   GFRNONAA >60 04/15/2022 0143    Lab Results  Component Value Date   TSH 0.937 04/11/2022       ASSESSMENT AND PLAN  Multiple sclerosis, relapsing-remitting  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   Therefore, I switched her from Actemra  to Ocrevus  to place on a medication that would be beneficial for both diseases.  She tolerated her first dose well and her next dose will be in about a month.  We will check some blood work today.  Additionally I will check an MRI of the brain and cervical spine to ensure that she is not having significant breakthrough activity.    She has a right foot drop.  We discussed this might be worse when she is tired or in the heat Stay active and exercise as tolerated Continue gabapentin  -consider increasing the dose if dysesthesias worsen.  Continue armodafinil  for fatigue.. Rtc 6 months, sooner if new or worsening neurologic symptom.    Shaylynne Lunt A. Vear, MD, [facility] 11/04/2024, 10:49 AM Certified in Neurology, Clinical  Neurophysiology, Sleep Medicine and Neuroimaging  [facility] [address], [address] [place], [place] [address] [phone]

## 2024-11-05 ENCOUNTER — Ambulatory Visit: Payer: Self-pay | Admitting: Neurology

## 2024-11-05 LAB — CBC WITH DIFFERENTIAL/PLATELET
Basophils Absolute: 0 x10E3/uL (ref 0.0–0.2)
Basos: 1 %
EOS (ABSOLUTE): 0.1 x10E3/uL (ref 0.0–0.4)
Eos: 2 %
Hematocrit: 41.7 % (ref 34.0–46.6)
Hemoglobin: 13.1 g/dL (ref 11.1–15.9)
Immature Grans (Abs): 0 x10E3/uL (ref 0.0–0.1)
Immature Granulocytes: 0 %
Lymphocytes Absolute: 1.5 x10E3/uL (ref 0.7–3.1)
Lymphs: 32 %
MCH: 28.4 pg (ref 26.6–33.0)
MCHC: 31.4 g/dL — ABNORMAL LOW (ref 31.5–35.7)
MCV: 90 fL (ref 79–97)
Monocytes Absolute: 0.4 x10E3/uL (ref 0.1–0.9)
Monocytes: 8 %
Neutrophils Absolute: 2.7 x10E3/uL (ref 1.4–7.0)
Neutrophils: 57 %
Platelets: 263 x10E3/uL (ref 150–450)
RBC: 4.62 x10E6/uL (ref 3.77–5.28)
RDW: 12.9 % (ref 11.7–15.4)
WBC: 4.7 x10E3/uL (ref 3.4–10.8)

## 2024-11-05 LAB — IGG, IGA, IGM
IgG (Immunoglobin G), Serum: 1401 mg/dL (ref 586–1602)
IgM (Immunoglobulin M), Srm: 63 mg/dL (ref 26–217)
Immunoglobulin A, (IgA) QN, Serum: 124 mg/dL (ref 87–352)

## 2024-11-11 ENCOUNTER — Telehealth: Payer: Self-pay | Admitting: Neurology

## 2024-11-11 NOTE — Telephone Encounter (Signed)
 I called her back and got her scheduled for 12/30 at [facility].

## 2024-11-11 NOTE — Telephone Encounter (Signed)
 Pt called to request to schedule MRI  APPT , Informed that coordinator will give her a calll back to schedule

## 2024-11-23 ENCOUNTER — Ambulatory Visit (INDEPENDENT_AMBULATORY_CARE_PROVIDER_SITE_OTHER)

## 2024-11-23 DIAGNOSIS — R269 Unspecified abnormalities of gait and mobility: Secondary | ICD-10-CM

## 2024-11-23 DIAGNOSIS — M21371 Foot drop, right foot: Secondary | ICD-10-CM

## 2024-11-23 DIAGNOSIS — G379 Demyelinating disease of central nervous system, unspecified: Secondary | ICD-10-CM

## 2024-11-23 DIAGNOSIS — G35A Relapsing-remitting multiple sclerosis: Secondary | ICD-10-CM

## 2024-11-23 MED ORDER — GADOBENATE DIMEGLUMINE 529 MG/ML IV SOLN
13.0000 mL | Freq: Once | INTRAVENOUS | Status: AC | PRN
  Administered 2024-11-23: 13 mL via INTRAVENOUS

## 2024-12-02 ENCOUNTER — Encounter: Payer: Self-pay | Admitting: Neurology

## 2024-12-02 NOTE — Telephone Encounter (Signed)
 I sent a msg on teams to infusion to see what the hold up is:

## 2024-12-15 ENCOUNTER — Emergency Department (HOSPITAL_BASED_OUTPATIENT_CLINIC_OR_DEPARTMENT_OTHER)
Admission: EM | Admit: 2024-12-15 | Discharge: 2024-12-15 | Disposition: A | Discharge status: Home / Self Care | Attending: Emergency Medicine | Admitting: Emergency Medicine

## 2024-12-15 ENCOUNTER — Other Ambulatory Visit: Payer: Self-pay

## 2024-12-15 ENCOUNTER — Other Ambulatory Visit (HOSPITAL_COMMUNITY): Payer: Self-pay

## 2024-12-15 ENCOUNTER — Emergency Department (HOSPITAL_BASED_OUTPATIENT_CLINIC_OR_DEPARTMENT_OTHER): Admitting: Radiology

## 2024-12-15 DIAGNOSIS — R0602 Shortness of breath: Secondary | ICD-10-CM | POA: Diagnosis not present

## 2024-12-15 DIAGNOSIS — R079 Chest pain, unspecified: Secondary | ICD-10-CM | POA: Insufficient documentation

## 2024-12-15 DIAGNOSIS — R110 Nausea: Secondary | ICD-10-CM | POA: Insufficient documentation

## 2024-12-15 LAB — CBC
HCT: 39.5 % (ref 36.0–46.0)
Hemoglobin: 13.3 g/dL (ref 12.0–15.0)
MCH: 28.9 pg (ref 26.0–34.0)
MCHC: 33.7 g/dL (ref 30.0–36.0)
MCV: 85.7 fL (ref 80.0–100.0)
Platelets: 248 K/uL (ref 150–400)
RBC: 4.61 MIL/uL (ref 3.87–5.11)
RDW: 14.4 % (ref 11.5–15.5)
WBC: 4.4 K/uL (ref 4.0–10.5)
nRBC: 0 % (ref 0.0–0.2)

## 2024-12-15 LAB — BASIC METABOLIC PANEL WITH GFR
Anion gap: 12 (ref 5–15)
BUN: 6 mg/dL (ref 6–20)
CO2: 21 mmol/L — ABNORMAL LOW (ref 22–32)
Calcium: 9.5 mg/dL (ref 8.9–10.3)
Chloride: 104 mmol/L (ref 98–111)
Creatinine, Ser: 0.67 mg/dL (ref 0.44–1.00)
GFR, Estimated: >60 mL/min
Glucose, Bld: 87 mg/dL (ref 70–99)
Potassium: 3.8 mmol/L (ref 3.5–5.1)
Sodium: 137 mmol/L (ref 135–145)

## 2024-12-15 LAB — TROPONIN T, HIGH SENSITIVITY
Troponin T High Sensitivity: <6 ng/L (ref 0–19)
Troponin T High Sensitivity: <6 ng/L (ref 0–19)

## 2024-12-15 LAB — D-DIMER, QUANTITATIVE: D-Dimer, Quant: 0.42 ug{FEU}/mL (ref 0.00–0.50)

## 2024-12-15 MED ORDER — IBUPROFEN 400 MG PO TABS: 600.0000 mg | ORAL_TABLET | Freq: Once | ORAL | Status: DC

## 2024-12-15 NOTE — Discharge Instructions (Signed)
 You were seen in the emerged part for chest pain Your blood work EKG and chest x-ray all looked okay There is no evidence of heart attack or blood clot in your lungs We are not certain what is causing your chest pain Continue taking all previous prescribed indications Take Tylenol  Motrin  as directed for pain Return to the Emergency Department for chest pain or trouble breathing

## 2024-12-15 NOTE — ED Provider Notes (Signed)
 " [place] EMERGENCY DEPARTMENT AT [facility] Provider Note   CSN: [id number] Arrival date & time: 12/15/24  1326     Patient presents with: Chest Pain   Robin Mercer is a 24 y.o. female.with a PMHx of MS, and anxiety who presents to the ED for chest pain.  Chest pain began at 0400 this morning.  Localized over substernal region made worse with movement and deep breathing.  She developed shortness of breath later this afternoon.  Associated nausea as well.  No vomiting fevers chills recent illness.  Currently on menstrual period.  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 Authorizing Provider  Armodafinil  200 MG TABS Take 1 tablet (200 mg total) by mouth in the morning. 09/20/24   Sater, Charlie LABOR, MD  gabapentin  (NEURONTIN ) 400 MG capsule Take 1 capsule (400 mg total) by mouth 4 (four) times daily. 10/18/24   Sater, Charlie LABOR, MD  sertraline  (ZOLOFT ) 100 MG tablet Take 1/2 tablet (50 mg total) by mouth daily. 04/13/24   Sater, Charlie LABOR, MD  Vitamin D , Ergocalciferol , (DRISDOL ) 1.25 MG (50000 UNIT) CAPS capsule Take 1 capsule (50,000 Units total) by mouth every 7 (seven) days. 10/18/24   Sater, Charlie LABOR, MD    Allergies: Patient has no known allergies.    Review of Systems  Cardiovascular:  Positive for chest pain.    Updated Vital Signs BP 108/79   Pulse 73   Temp 97.6 F (36.4 C) (Oral)   Resp 14   SpO2 99%   Physical Exam Vitals and nursing note reviewed.  HENT:     Head: Normocephalic and atraumatic.  Eyes:     Pupils: Pupils are equal, round, and reactive to light.  Cardiovascular:     Rate and Rhythm: Normal rate and regular rhythm.  Pulmonary:     Effort: Pulmonary effort is normal.     Breath sounds: Normal breath sounds.  Chest:      Chest wall: No tenderness or crepitus.  Abdominal:     Palpations: Abdomen is soft.     Tenderness: There is no abdominal tenderness.  Musculoskeletal:     Right lower leg: No edema.     Left lower leg: No edema.  Skin:    General: Skin is warm and dry.  Neurological:     Mental Status: She is alert.  Psychiatric:        Mood and Affect: Mood normal.     (all labs ordered are listed, but only abnormal results are displayed) Labs Reviewed  BASIC METABOLIC PANEL WITH GFR - Abnormal; Notable for the following components:      Result Value   CO2 21 (*)    All other components within normal limits  CBC  D-DIMER, QUANTITATIVE  PREGNANCY, URINE  TROPONIN T, HIGH SENSITIVITY  TROPONIN T, HIGH SENSITIVITY    EKG: None  Radiology: DG Chest 2 View Result Date: 12/15/2024 CLINICAL DATA:  Chest pain EXAM: CHEST - 2 VIEW COMPARISON:  None available. FINDINGS: Cardiomediastinal silhouette and pulmonary vasculature are within normal limits. Lungs are clear. IMPRESSION: No acute cardiopulmonary process. Electronically Signed   By: Aliene Lloyd M.D.   On: 12/15/2024 14:06     Procedures   Medications Ordered in the ED  ibuprofen  (ADVIL ) tablet 600 mg (has no administration in time range)    Clinical Course as of 12/15/24 1742  Wed Dec 15, 2024  1740 Initial laboratory workup unremarkable.  High-sensitivity troponin is negative delta troponin is negative.  No elevation in D-dimer.  Low suspicion for ACS or PE.  Chest x-ray looks clear.  I went back into the patient's room to inform her of these results and she is very dissatisfied that the results are taking so long and she has not had an update yet or received the ibuprofen  that was ordered..  Given that her workup is negative here today she will be discharged.  She did not provide a urine sample for urine pregnancy [MP]    Clinical Course User Index [MP] [name] LABOR, DO                                 Medical Decision  Making 24 year old female history as above presents to the ED for chest pain that started this morning.  No prior history of similar episodes of chest pain.  Now with associated shortness of breath.  Made worse with changes in position and deep breaths.  Not reproducible on my exam.  Differential diagnosis includes ACS, PE, dysrhythmia, anemia, viral respiratory illness pneumonia and costochondritis.  Will obtain laboratory workup including high sensitive troponin D-dimer EKG chest x-ray and continue to monitor.  Will need CTA of the chest if D-dimer significantly elevated.  Amount and/or Complexity of Data Reviewed Labs: ordered. Radiology: ordered.        Final diagnoses:  Chest pain, unspecified type    ED Discharge Orders     None          [name] LABOR, DO 12/15/24 1742  "

## 2024-12-15 NOTE — ED Triage Notes (Signed)
 Reports central CP and nausea starting this morning. States pain is worse with movement.   Also started menstrual cycle today. States always has bad first days

## 2024-12-22 ENCOUNTER — Other Ambulatory Visit (HOSPITAL_COMMUNITY): Payer: Self-pay

## 2025-02-04 ENCOUNTER — Ambulatory Visit: Admitting: Obstetrics and Gynecology

## 2025-07-05 ENCOUNTER — Ambulatory Visit: Admitting: Neurology

## 8387-07-27 DEATH — deceased
